# Patient Record
Sex: Female | Born: 1950 | Race: White | Hispanic: No | Smoking: Never smoker
Health system: Southern US, Community
[De-identification: ages and names within clinical notes are randomized; demographics above are authoritative.]

## PROBLEM LIST (undated history)

## (undated) DIAGNOSIS — E039 Hypothyroidism, unspecified: Secondary | ICD-10-CM

## (undated) DIAGNOSIS — E785 Hyperlipidemia, unspecified: Secondary | ICD-10-CM

## (undated) HISTORY — PX: ABDOMINAL HYSTERECTOMY: SHX81

## (undated) HISTORY — PX: CHOLECYSTECTOMY: SHX55

## (undated) HISTORY — DX: Hyperlipidemia, unspecified: E78.5

---

## 1999-12-27 ENCOUNTER — Encounter: Admission: RE | Admit: 1999-12-27 | Discharge: 1999-12-27 | Payer: Self-pay | Admitting: Internal Medicine

## 1999-12-27 ENCOUNTER — Encounter: Payer: Self-pay | Admitting: Internal Medicine

## 2000-01-07 ENCOUNTER — Encounter: Payer: Self-pay | Admitting: Internal Medicine

## 2000-01-07 ENCOUNTER — Encounter: Admission: RE | Admit: 2000-01-07 | Discharge: 2000-01-07 | Payer: Self-pay | Admitting: Internal Medicine

## 2000-01-10 ENCOUNTER — Other Ambulatory Visit: Admission: RE | Admit: 2000-01-10 | Discharge: 2000-01-10 | Payer: Self-pay | Admitting: Obstetrics and Gynecology

## 2001-07-09 ENCOUNTER — Encounter: Admission: RE | Admit: 2001-07-09 | Discharge: 2001-07-09 | Payer: Self-pay | Admitting: Internal Medicine

## 2001-07-09 ENCOUNTER — Encounter: Payer: Self-pay | Admitting: Internal Medicine

## 2001-08-14 ENCOUNTER — Other Ambulatory Visit: Admission: RE | Admit: 2001-08-14 | Discharge: 2001-08-14 | Payer: Self-pay | Admitting: Obstetrics and Gynecology

## 2002-08-17 ENCOUNTER — Encounter: Payer: Self-pay | Admitting: Obstetrics and Gynecology

## 2002-08-17 ENCOUNTER — Ambulatory Visit (HOSPITAL_COMMUNITY): Admission: RE | Admit: 2002-08-17 | Discharge: 2002-08-17 | Payer: Self-pay | Admitting: Obstetrics and Gynecology

## 2002-08-17 ENCOUNTER — Other Ambulatory Visit: Admission: RE | Admit: 2002-08-17 | Discharge: 2002-08-17 | Payer: Self-pay | Admitting: Obstetrics and Gynecology

## 2002-08-26 ENCOUNTER — Encounter: Admission: RE | Admit: 2002-08-26 | Discharge: 2002-10-29 | Payer: Self-pay | Admitting: Internal Medicine

## 2003-10-31 ENCOUNTER — Encounter (INDEPENDENT_AMBULATORY_CARE_PROVIDER_SITE_OTHER): Payer: Self-pay | Admitting: Specialist

## 2003-10-31 ENCOUNTER — Ambulatory Visit (HOSPITAL_COMMUNITY): Admission: RE | Admit: 2003-10-31 | Discharge: 2003-10-31 | Payer: Self-pay | Admitting: Gastroenterology

## 2004-11-26 ENCOUNTER — Ambulatory Visit (HOSPITAL_COMMUNITY): Admission: RE | Admit: 2004-11-26 | Discharge: 2004-11-26 | Payer: Self-pay | Admitting: Internal Medicine

## 2004-12-26 ENCOUNTER — Encounter: Admission: RE | Admit: 2004-12-26 | Discharge: 2004-12-26 | Payer: Self-pay | Admitting: Internal Medicine

## 2005-01-16 ENCOUNTER — Other Ambulatory Visit: Admission: RE | Admit: 2005-01-16 | Discharge: 2005-01-16 | Payer: Self-pay | Admitting: Obstetrics and Gynecology

## 2005-01-17 ENCOUNTER — Ambulatory Visit (HOSPITAL_COMMUNITY): Admission: RE | Admit: 2005-01-17 | Discharge: 2005-01-17 | Payer: Self-pay | Admitting: Obstetrics and Gynecology

## 2006-04-05 ENCOUNTER — Inpatient Hospital Stay (HOSPITAL_COMMUNITY): Admission: EM | Admit: 2006-04-05 | Discharge: 2006-04-06 | Payer: Self-pay | Admitting: Emergency Medicine

## 2006-04-05 ENCOUNTER — Encounter (INDEPENDENT_AMBULATORY_CARE_PROVIDER_SITE_OTHER): Payer: Self-pay | Admitting: *Deleted

## 2006-07-17 ENCOUNTER — Ambulatory Visit (HOSPITAL_COMMUNITY): Admission: RE | Admit: 2006-07-17 | Discharge: 2006-07-17 | Payer: Self-pay | Admitting: Obstetrics and Gynecology

## 2006-08-05 ENCOUNTER — Other Ambulatory Visit: Admission: RE | Admit: 2006-08-05 | Discharge: 2006-08-05 | Payer: Self-pay | Admitting: Obstetrics and Gynecology

## 2007-10-23 ENCOUNTER — Encounter: Admission: RE | Admit: 2007-10-23 | Discharge: 2007-10-23 | Payer: Self-pay | Admitting: Internal Medicine

## 2007-11-09 ENCOUNTER — Other Ambulatory Visit: Admission: RE | Admit: 2007-11-09 | Discharge: 2007-11-09 | Payer: Self-pay | Admitting: Obstetrics and Gynecology

## 2008-10-06 ENCOUNTER — Encounter: Admission: RE | Admit: 2008-10-06 | Discharge: 2008-10-06 | Payer: Self-pay | Admitting: Internal Medicine

## 2009-05-19 ENCOUNTER — Encounter: Admission: RE | Admit: 2009-05-19 | Discharge: 2009-05-19 | Payer: Self-pay | Admitting: Obstetrics and Gynecology

## 2010-05-17 ENCOUNTER — Ambulatory Visit: Payer: Self-pay | Admitting: Obstetrics and Gynecology

## 2010-05-17 ENCOUNTER — Other Ambulatory Visit: Admission: RE | Admit: 2010-05-17 | Discharge: 2010-05-17 | Payer: Self-pay | Admitting: Obstetrics and Gynecology

## 2010-10-28 ENCOUNTER — Encounter: Payer: Self-pay | Admitting: Internal Medicine

## 2011-02-22 NOTE — H&P (Signed)
NAMEKATTIA, SELLEY NO.:  [id number]   MEDICAL RECORD NO.:  [id number]          PATIENT TYPE:  EMS   LOCATION:  MAJO                         FACILITY:  [facility]   PHYSICIAN:  Ollen Gross. Vernell Morgans, M.D. DATE OF BIRTH:  Jan 08, 1951   DATE OF ADMISSION:  04/05/2006  DATE OF DISCHARGE:                                HISTORY & PHYSICAL   Ms. Walstad is 60 year old white female who presents with right upper quadrant  pain for the last 2 days.  The pain got acutely worse last night and was  associated with some significant nausea and vomiting.  This morning, she is  not running any fevers.  She feels a little bit better now that she has had  some pain medicine.  She otherwise denies any chest pain, shortness of  breath, diarrhea, dysuria.  The rest of review of systems is unremarkable.   PAST MEDICAL HISTORY:  Significant for acid reflux, irritable bowel  syndrome, gallstones.   PAST SURGICAL HISTORY:  Significant for laparoscopic tubal ligation and  transvaginal hysterectomy, and repair of her bladder.   MEDICATIONS:  Include Prevacid, multivitamin.   ALLERGIES:  PENICILLIN.   SOCIAL HISTORY:  She denies use of alcohol or tobacco products.   FAMILY HISTORY:  Noncontributory.   PHYSICAL EXAMINATION:  VITAL SIGNS:  Her temperature is 98.9, blood pressure  149/88, pulse 94.  GENERAL:  She is a well-developed, well-nourished white female in no acute  distress.  SKIN:  Warm and dry, no jaundice.  HEENT:  Eyes:  Extraocular muscles intact.  Pupils equal, round, and  reactive to light.  Sclerae anicteric.  LUNGS:  Clear bilaterally with no use of accessory respiratory muscles.  HEART:  Has a regular rate and rhythm with an impulse in the left chest.  ABDOMEN:  Soft, with focal right upper quadrant tenderness and some  fullness.  No peritonitis.  EXTREMITIES:  No cyanosis, clubbing, or edema.  Good strength in her arms  and legs.  PSYCHOLOGICALLY:  She is alert and  oriented x3 with no anxiety or  depression.   LABORATORY DATA:  Significant for a normal white count, normal liver  functions.  On review of her ultrasound, she did have stones in her  gallbladder, but no apparent gallbladder wall thickening or ductal  dilatation.   ASSESSMENT/PLAN:  This is a 60 year old white female with what appears to be  some significant gallbladder pain.  Because of the risks of this continuing,  I do think she would benefit from having her gallbladder removed.  She would  also like to have this done.  I have explained to her in detail the risks  and benefits of the operation to  remove the gallbladder, as well as some of the technical aspects, and she  understands and wishes to proceed.  We will obtain some routine preoperative  lab work in preparation of doing this for her hopefully later today or  tomorrow.      Ollen Gross. Vernell Morgans, M.D.  Electronically Signed     PST/MEDQ  D:  04/05/2006  T:  04/05/2006  Job:  [id number]

## 2011-02-22 NOTE — Op Note (Signed)
NAME:  Alexandra Mcgrath, Alexandra Mcgrath                          ACCOUNT NO.:  [id number]   MEDICAL RECORD NO.:  [id number]                   PATIENT TYPE:  AMB   LOCATION:  ENDO                                 FACILITY:  [facility]   PHYSICIAN:  Danise Edge, M.D.                DATE OF BIRTH:  06/22/1951   DATE OF PROCEDURE:  10/31/2003  DATE OF DISCHARGE:                                 OPERATIVE REPORT   PROCEDURE:  Colonoscopy with polypectomy.   ENDOSCOPIST:  Verlin Grills, M.D.   REFERRING PHYSICIAN:  Theressa Millard, M.D.   INDICATIONS FOR PROCEDURE:  [name] is a 60 year old female born  01/19/1951.  [name] is scheduled to undergo her first screening  colonoscopy with polypectomy to prevent colon cancer.   PREMEDICATION:  Versed 8 mg, Demerol 80 mg.   DESCRIPTION OF PROCEDURE:  After obtaining the informed consent, [name]  was placed in the left lateral decubitus position.  I administered  intravenous Demerol and intravenous Versed to achieve conscious sedation  before the procedure.  The patient's blood pressure, oxygen saturation, and  cardiac rhythm were monitored throughout the procedure and documented in the  medical record.   Anal inspection was normal.  Digital rectal exam was normal.  The Olympus  adjustable pediatric video colonoscope was introduced into the rectum and  easily advanced to the cecum.  Colonic preparation for the exam today was  excellent.   Rectum normal.   Sigmoid colon and descending colon:  From the distal sigmoid colon at 30 cm  from the anal verge, a 1 mm sessile polyp was removed with cold biopsy  forceps.   Splenic flexure normal.   Transverse colon:  From the proximal transverse colon a 5 mm sessile polyp  was removed with the electrocautery snare.   Hepatic flexure normal.   Ascending colon normal.   Cecum and ileocecal valve normal.   ASSESSMENT:  A 5 mm polyp was removed from the proximal transverse colon.  A  1 mm  sessile was removed from the distal sigmoid colon.  Both polyps were  submitted in one bottle for pathologic evaluation.   RECOMMENDATIONS:  Repeat colonoscopy in five years if polyps return  neoplastic pathologically.                                               Danise Edge, M.D.    MJ/MEDQ  D:  10/31/2003  T:  10/31/2003  Job:  [id number]   cc:   Theressa Millard, M.D.  [address] [place]  [place] [address]  Fax: [phone]

## 2011-02-22 NOTE — Op Note (Signed)
NAMELINSAY, VOGT NO.:  [id number]   MEDICAL RECORD NO.:  [id number]          PATIENT TYPE:  INP   LOCATION:  5733                         FACILITY:  [facility]   PHYSICIAN:  Ollen Gross. Vernell Morgans, M.D. DATE OF BIRTH:  1951-09-29   DATE OF PROCEDURE:  04/05/2006  DATE OF DISCHARGE:                                 OPERATIVE REPORT   PREOPERATIVE DIAGNOSES:  Cholecystitis, cholelithiasis.   POSTOPERATIVE DIAGNOSES:  Cholecystitis, cholelithiasis.   PROCEDURE:  Laparoscopic cholecystectomy.   SURGEON:  Ollen Gross. Carolynne Edouard, M.D.   ASSISTANT:  Leonie Man, M.D.   ANESTHESIA:  General endotracheal.   PROCEDURE:  After informed consent was obtained, the patient was brought to  the operating room and placed in the supine position on the operating room  table.  After adequate induction of general anesthesia, the patient's  abdomen was prepped with Betadine and draped in the usual sterile manner.  The area below the umbilicus was infiltrated with 0.25% Marcaine.  A small  incision was made with the #15 blade knife.  This incision was carried down  through the subcutaneous tissue bluntly with a hemostat and [employer]  retractors, until the linea alba was identified.  The linea alba was incised  with a 15 blade knife, and each side was grasped with Kocher clamps and  elevated anteriorly.  The preperitoneal space was then probed bluntly with a  hemostat, until the peritoneum was opened and access was gained to the  abdominal cavity.  A 0 Vicryl pursestring stitch was placed in the fascia  surrounding the opening.  Hasson cannula was placed through the opening and  anchored in place with the previously placed Vicryl pursestring stitch.  The  abdomen was then insufflated with carbon dioxide without difficulty.  The  patient was placed in the head-up position and rotated slightly with the  right side up.  The laparoscope was then inserted through the Hasson cannula  and the right  upper quadrant was inspected.  The gallbladder appeared to be  very inflamed and enlarged and was readily identifiable.   Next, the epigastric region was infiltrated with 0.25% Marcaine.  A small  incision was made with a #15 blade knife.  A 10 mm port was then placed  bluntly through this incision into the abdominal cavity under direct vision.  Sites were then chosen laterally on the right side of the abdomen, and  placement of 5 mm ports.  Each of these areas were infiltrated 0.25%  Marcaine.  Small stab incisions were made with a #15 blade knife and 5 mm  ports placed bluntly through these incisions into the abdominal cavity under  direct vision.  A Nezhat aspirator was then placed through the epigastric  port.  The dome of the gallbladder was punctured and aspirated.  The  gallbladder was full of clear fluid.  A blunt grasper was then placed  through the lateral-most 5 mm port, and used to grasp the dome of the  gallbladder and elevate it anteriorly and superiorly.  Another blunt grasper  was placed through the other 5 mm  port and used to retract on the body and  neck of the gallbladder.  A dissector was placed through the epigastric  port, and using the electrocautery the peritoneal reflection at the  gallbladder neck area was opened.  Blunt dissection was then carried out in  this area, until the gallbladder neck-cystic duct junction was readily  identified and a good window was created.  A single clip was placed on the  gallbladder neck.  A small ductotomy was made just below the clip.  A 14  gauge Angiocath was placed percutaneously through the anterior abdominal  wall under direct vision.  The Reddick cholangiogram catheter was placed  through the Angiocath and flushed.   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   At this  point the cholangiogram was abandoned.  The 2 clips were placed proximally  on the cystic duct and the duct was divided between the 2 sets of clips.  Posteriorly the cystic artery was identified and again dissected bluntly in  a circumferential manner, until a good window was created.  Two clips were  placed proximally and distally on the artery, and the artery was divided  between the 2 sets of clips.   Next, a laparoscopic hook cautery device was used to separate the  gallbladder from the liver bed, prior to completely detaching the  gallbladder from the liver bed.  The liver bed was inspected and several  small bleeding points were coagulated with the electrocautery, until the  area was completely hemostatic.  The gallbladder was then detached the rest  of the way from the liver bed without difficulty, using the hook  electrocautery.  A laparoscopic bag was inserted through to the epigastric  port.  The gallbladder was placed in the bag and the bag was sealed.  The  abdomen was then irrigated with copious amounts of saline until the effluent  was clear.  The laparoscope was then moved to the epigastric port.  A  gallbladder grasper was placed through the Hasson cannula and used to grasp  the opening of  the bag.  The bag with the gallbladder was then removed  through the infraumbilical port without difficulty.  The fascial defect was  closed with the previously placed Vicryl pursestring stitch, as well as with  another interrupted figure-of-eight 0 Vicryl stitch.  The rest of the ports  were removed under direct vision and were found to be hemostatic.  Gas was  allowed to escape.  The skin incisions were all closed with interrupted 4-0  Monocryl subcuticular stitches.  Benzoin, Steri-Strips and  sterile  dressings were applied.  The patient tolerated the procedure well.  At the end of the case all needle,  sponge and instrument counts were correct.  The  patient was then  awakened and taken to recovery room in stable condition.      Ollen Gross. Vernell Morgans, M.D.  Electronically Signed     PST/MEDQ  D:  04/05/2006  T:  04/05/2006  Job:  [id number]

## 2011-03-01 ENCOUNTER — Other Ambulatory Visit: Payer: Self-pay | Admitting: Internal Medicine

## 2011-03-01 DIAGNOSIS — Z1239 Encounter for other screening for malignant neoplasm of breast: Secondary | ICD-10-CM

## 2011-03-01 DIAGNOSIS — Z1231 Encounter for screening mammogram for malignant neoplasm of breast: Secondary | ICD-10-CM

## 2011-05-07 ENCOUNTER — Ambulatory Visit
Admission: RE | Admit: 2011-05-07 | Discharge: 2011-05-07 | Disposition: A | Discharge status: Home / Self Care | Payer: 59 | Source: Ambulatory Visit | Attending: Internal Medicine | Admitting: Internal Medicine

## 2011-05-07 DIAGNOSIS — Z1231 Encounter for screening mammogram for malignant neoplasm of breast: Secondary | ICD-10-CM

## 2011-06-04 ENCOUNTER — Ambulatory Visit: Payer: Self-pay

## 2012-04-02 ENCOUNTER — Other Ambulatory Visit: Payer: Self-pay | Admitting: Internal Medicine

## 2012-04-02 DIAGNOSIS — Z1231 Encounter for screening mammogram for malignant neoplasm of breast: Secondary | ICD-10-CM

## 2012-05-13 ENCOUNTER — Ambulatory Visit: Payer: 59

## 2012-05-19 ENCOUNTER — Ambulatory Visit
Admission: RE | Admit: 2012-05-19 | Discharge: 2012-05-19 | Disposition: A | Discharge status: Home / Self Care | Payer: 59 | Source: Ambulatory Visit | Attending: Internal Medicine | Admitting: Internal Medicine

## 2012-05-19 DIAGNOSIS — Z1231 Encounter for screening mammogram for malignant neoplasm of breast: Secondary | ICD-10-CM

## 2013-11-25 ENCOUNTER — Other Ambulatory Visit: Payer: Self-pay | Admitting: Gastroenterology

## 2014-06-23 ENCOUNTER — Other Ambulatory Visit: Payer: Self-pay

## 2014-06-23 DIAGNOSIS — Z1231 Encounter for screening mammogram for malignant neoplasm of breast: Secondary | ICD-10-CM

## 2014-07-01 ENCOUNTER — Ambulatory Visit
Admission: RE | Admit: 2014-07-01 | Discharge: 2014-07-01 | Disposition: A | Discharge status: Home / Self Care | Payer: 59 | Source: Ambulatory Visit

## 2014-07-01 ENCOUNTER — Encounter (INDEPENDENT_AMBULATORY_CARE_PROVIDER_SITE_OTHER): Payer: Self-pay

## 2014-07-01 DIAGNOSIS — Z1231 Encounter for screening mammogram for malignant neoplasm of breast: Secondary | ICD-10-CM

## 2014-08-10 ENCOUNTER — Other Ambulatory Visit: Payer: Self-pay | Admitting: Gastroenterology

## 2014-11-22 ENCOUNTER — Encounter (HOSPITAL_COMMUNITY): Payer: Self-pay | Admitting: *Deleted

## 2014-11-24 ENCOUNTER — Other Ambulatory Visit: Payer: Self-pay | Admitting: Gastroenterology

## 2014-11-29 ENCOUNTER — Ambulatory Visit (HOSPITAL_COMMUNITY)
Admission: RE | Admit: 2014-11-29 | Discharge: 2014-11-29 | Disposition: A | Discharge status: Home / Self Care | Payer: 59 | Source: Ambulatory Visit | Attending: Gastroenterology | Admitting: Gastroenterology

## 2014-11-29 ENCOUNTER — Encounter (HOSPITAL_COMMUNITY)
Admission: RE | Disposition: A | Discharge status: Home / Self Care | Payer: Self-pay | Source: Ambulatory Visit | Attending: Gastroenterology

## 2014-11-29 ENCOUNTER — Encounter (HOSPITAL_COMMUNITY): Payer: Self-pay

## 2014-11-29 ENCOUNTER — Ambulatory Visit (HOSPITAL_COMMUNITY): Payer: 59 | Admitting: Anesthesiology

## 2014-11-29 DIAGNOSIS — K219 Gastro-esophageal reflux disease without esophagitis: Secondary | ICD-10-CM | POA: Diagnosis not present

## 2014-11-29 DIAGNOSIS — Z9889 Other specified postprocedural states: Secondary | ICD-10-CM | POA: Insufficient documentation

## 2014-11-29 DIAGNOSIS — E039 Hypothyroidism, unspecified: Secondary | ICD-10-CM | POA: Diagnosis not present

## 2014-11-29 DIAGNOSIS — Z86010 Personal history of colonic polyps: Secondary | ICD-10-CM | POA: Insufficient documentation

## 2014-11-29 DIAGNOSIS — K589 Irritable bowel syndrome without diarrhea: Secondary | ICD-10-CM | POA: Insufficient documentation

## 2014-11-29 DIAGNOSIS — Z90710 Acquired absence of both cervix and uterus: Secondary | ICD-10-CM | POA: Diagnosis not present

## 2014-11-29 DIAGNOSIS — Z09 Encounter for follow-up examination after completed treatment for conditions other than malignant neoplasm: Secondary | ICD-10-CM | POA: Diagnosis not present

## 2014-11-29 DIAGNOSIS — M810 Age-related osteoporosis without current pathological fracture: Secondary | ICD-10-CM | POA: Insufficient documentation

## 2014-11-29 HISTORY — DX: Hypothyroidism, unspecified: E03.9

## 2014-11-29 HISTORY — PX: COLONOSCOPY WITH PROPOFOL: SHX5780

## 2014-11-29 SURGERY — COLONOSCOPY WITH PROPOFOL
Anesthesia: Monitor Anesthesia Care

## 2014-11-29 MED ORDER — FENTANYL CITRATE 0.05 MG/ML IJ SOLN: 25.0000 ug | INTRAMUSCULAR | Status: DC | PRN

## 2014-11-29 MED ORDER — PROPOFOL 10 MG/ML IV BOLUS
INTRAVENOUS | Status: AC
  Filled 2014-11-29: qty 20

## 2014-11-29 MED ORDER — LIDOCAINE HCL (CARDIAC) 20 MG/ML IV SOLN
INTRAVENOUS | Status: AC
  Filled 2014-11-29: qty 5

## 2014-11-29 MED ORDER — LACTATED RINGERS IV SOLN
INTRAVENOUS | Status: DC
  Administered 2014-11-29: 1000 mL via INTRAVENOUS

## 2014-11-29 MED ORDER — PROPOFOL INFUSION 10 MG/ML OPTIME
INTRAVENOUS | Status: DC | PRN
  Administered 2014-11-29: 250 ug/kg/min via INTRAVENOUS

## 2014-11-29 MED ORDER — PROPOFOL 10 MG/ML IV BOLUS
INTRAVENOUS | Status: DC | PRN
  Administered 2014-11-29: 40 mg via INTRAVENOUS
  Administered 2014-11-29: 30 mg via INTRAVENOUS
  Administered 2014-11-29: 50 mg via INTRAVENOUS
  Administered 2014-11-29 (×2): 10 mg via INTRAVENOUS
  Administered 2014-11-29: 30 mg via INTRAVENOUS

## 2014-11-29 MED ORDER — LIDOCAINE HCL (CARDIAC) 20 MG/ML IV SOLN
INTRAVENOUS | Status: DC | PRN
  Administered 2014-11-29: 50 mg via INTRAVENOUS

## 2014-11-29 MED ORDER — GLYCOPYRROLATE 0.2 MG/ML IJ SOLN
INTRAMUSCULAR | Status: DC | PRN
  Administered 2014-11-29 (×4): .05 mg via INTRAVENOUS

## 2014-11-29 MED ORDER — KETAMINE HCL 10 MG/ML IJ SOLN
INTRAMUSCULAR | Status: AC
  Filled 2014-11-29: qty 1

## 2014-11-29 MED ORDER — GLYCOPYRROLATE 0.2 MG/ML IJ SOLN
INTRAMUSCULAR | Status: AC
  Filled 2014-11-29: qty 1

## 2014-11-29 MED ORDER — SODIUM CHLORIDE 0.9 % IV SOLN: INTRAVENOUS | Status: DC

## 2014-11-29 MED ORDER — KETAMINE HCL 10 MG/ML IJ SOLN
INTRAMUSCULAR | Status: DC | PRN
  Administered 2014-11-29: 20 mg via INTRAVENOUS

## 2014-11-29 SURGICAL SUPPLY — 22 items

## 2014-11-29 NOTE — Anesthesia Postprocedure Evaluation (Signed)
  Anesthesia Post-op Note  Patient: Alexandra Mcgrath  Procedure(s) Performed: Procedure(s): COLONOSCOPY WITH PROPOFOL (N/A)  Patient Location: PACU  Anesthesia Type: MAC  Level of Consciousness: awake and alert   Airway and Oxygen Therapy: Patient Spontanous Breathing  Post-op Pain: none  Post-op Assessment: Post-op Vital signs reviewed, Patient's Cardiovascular Status Stable and Respiratory Function Stable  Post-op Vital Signs: Reviewed  Filed Vitals:   11/29/14 0940  BP: 143/85  Pulse: 76  Temp:   Resp: 17    Complications: No apparent anesthesia complications

## 2014-11-29 NOTE — H&P (Signed)
  Procedure: Surveillance colonoscopy. 11/25/2013 colonoscopy with removal of a 10 mm sessile serrated adenomatous ascending colon polyp, a 7 mm sessile serrated ascending colon polyp, a 6 mm sessile serrated ascending colon polyp.  History: The patient is a 64 year old female born January 04, 1951. Her chronic watery diarrhea has resolved after using probiotic therapy. She is scheduled to undergo a surveillance colonoscopy today.  Past medical history: Cholecystectomy. Tonsillectomy. Tubal ligation. Hysterectomy. Bladder suspension surgery. Allergic rhinitis. Irritable bowel syndrome. Gastroesophageal reflux. Osteoporosis. Low back pain.  Medication allergies: Penicillin and sulfa drugs  Exam: The patient is alert and lying comfortably on the endoscopy stretcher. Abdomen is soft and nontender to palpation. Lungs are clear to auscultation. Cardiac exam reveals a regular rhythm.  Plan: Proceed with surveillance colonoscopy following removal of multiple sessile serrated adenomatous polyps from the ascending colon in February 2015.

## 2014-11-29 NOTE — Op Note (Signed)
Procedure: Surveillance colonoscopy. 11/25/2013 colonoscopy with removal of three sessile serrated adenomatous polyps ranging in diameter from 6 mm to 10 mm.  Endoscopist: Earle Gell  Premedication: Propofol administered by anesthesia  Procedure: The patient was placed in the left lateral decubitus position. Anal inspection and digital rectal exam were normal. The Pentax pediatric colonoscope was introduced into the rectum and advanced to the cecum. A normal-appearing appendiceal orifice was identified. A normal-appearing ileocecal valve was identified. Colonic preparation for the exam today was good. Withdrawal time was 14 minutes  Rectum. Normal. Retroflexed view of the distal rectum normal  Sigmoid colon and descending colon. Normal  Splenic flexure. Normal  Transverse colon. Normal  Hepatic flexure. Normal  Ascending colon. Normal  Cecum and ileocecal valve. Normal  Assessment: Normal surveillance colonoscopy  Recommendation: Schedule surveillance colonoscopy in 5 years

## 2014-11-29 NOTE — Discharge Instructions (Signed)
Colonoscopy, Care After °These instructions give you information on caring for yourself after your procedure. Your doctor may also give you more specific instructions. Call your doctor if you have any problems or questions after your procedure. °HOME CARE °· Do not drive for 24 hours. °· Do not sign important papers or use machinery for 24 hours. °· You may shower. °· You may go back to your usual activities, but go slower for the first 24 hours. °· Take rest breaks often during the first 24 hours. °· Walk around or use warm packs on your belly (abdomen) if you have belly cramping or gas. °· Drink enough fluids to keep your pee (urine) clear or pale yellow. °· Resume your normal diet. Avoid heavy or fried foods. °· Avoid drinking alcohol for 24 hours or as told by your doctor. °· Only take medicines as told by your doctor. °If a tissue sample (biopsy) was taken during the procedure:  °· Do not take aspirin or blood thinners for 7 days, or as told by your doctor. °· Do not drink alcohol for 7 days, or as told by your doctor. °· Eat soft foods for the first 24 hours. °GET HELP IF: °You still have a small amount of blood in your poop (stool) 2-3 days after the procedure. °GET HELP RIGHT AWAY IF: °· You have more than a small amount of blood in your poop. °· You see clumps of tissue (blood clots) in your poop. °· Your belly is puffy (swollen). °· You feel sick to your stomach (nauseous) or throw up (vomit). °· You have a fever. °· You have belly pain that gets worse and medicine does not help. °MAKE SURE YOU: °· Understand these instructions. °· Will watch your condition. °· Will get help right away if you are not doing well or get worse. °Document Released: 10/26/2010 Document Revised: 09/28/2013 Document Reviewed: 05/31/2013 °ExitCare® Patient Information ©2015 [employer]. This information is not intended to replace advice given to you by your health care provider. Make sure you discuss any questions you have with  your health care provider. ° °

## 2014-11-29 NOTE — Anesthesia Preprocedure Evaluation (Addendum)
Anesthesia Evaluation  Patient identified by MRN, date of birth, ID band Patient awake    Reviewed: Allergy & Precautions, H&P , NPO status , Patient's Chart, lab work & pertinent test results  Airway Mallampati: II  TM Distance: >3 FB Neck ROM: Full    Dental no notable dental hx. (+) Teeth Intact, Dental Advisory Given   Pulmonary neg pulmonary ROS,  breath sounds clear to auscultation  Pulmonary exam normal       Cardiovascular negative cardio ROS  Rhythm:Regular Rate:Normal     Neuro/Psych negative neurological ROS  negative psych ROS   GI/Hepatic negative GI ROS, Neg liver ROS,   Endo/Other  negative endocrine ROSHypothyroidism   Renal/GU negative Renal ROS  negative genitourinary   Musculoskeletal   Abdominal   Peds  Hematology negative hematology ROS (+)   Anesthesia Other Findings   Reproductive/Obstetrics negative OB ROS                            Anesthesia Physical Anesthesia Plan  ASA: II  Anesthesia Plan: MAC   Post-op Pain Management:    Induction: Intravenous  Airway Management Planned: Simple Face Mask  Additional Equipment:   Intra-op Plan:   Post-operative Plan:   Informed Consent: I have reviewed the patients History and Physical, chart, labs and discussed the procedure including the risks, benefits and alternatives for the proposed anesthesia with the patient or authorized representative who has indicated his/her understanding and acceptance.   Dental advisory given  Plan Discussed with: CRNA and Surgeon  Anesthesia Plan Comments:         Anesthesia Quick Evaluation

## 2014-11-29 NOTE — Transfer of Care (Signed)
Immediate Anesthesia Transfer of Care Note  Patient: Alexandra Mcgrath  Procedure(s) Performed: Procedure(s): COLONOSCOPY WITH PROPOFOL (N/A)  Patient Location: [facility]   Anesthesia Type:MAC  Level of Consciousness: Patient easily awoken, sedated, comfortable, cooperative, following commands, responds to stimulation.   Airway & Oxygen Therapy: Patient spontaneously breathing, ventilating well, oxygen via simple oxygen mask.  Post-op Assessment: Report given to PACU RN, vital signs reviewed and stable, moving all extremities.   Post vital signs: Reviewed and stable.  Complications: No apparent anesthesia complications

## 2014-11-30 ENCOUNTER — Encounter (HOSPITAL_COMMUNITY): Payer: Self-pay | Admitting: Gastroenterology

## 2015-08-29 ENCOUNTER — Ambulatory Visit: Payer: 59 | Admitting: Allergy and Immunology

## 2015-10-04 ENCOUNTER — Ambulatory Visit
Admission: RE | Admit: 2015-10-04 | Discharge: 2015-10-04 | Disposition: A | Discharge status: Home / Self Care | Payer: Commercial Managed Care - HMO | Source: Ambulatory Visit | Attending: Internal Medicine | Admitting: Internal Medicine

## 2015-10-04 ENCOUNTER — Other Ambulatory Visit: Payer: Self-pay | Admitting: Internal Medicine

## 2015-10-04 DIAGNOSIS — M79672 Pain in left foot: Secondary | ICD-10-CM

## 2016-06-29 DIAGNOSIS — N390 Urinary tract infection, site not specified: Secondary | ICD-10-CM | POA: Diagnosis not present

## 2016-07-09 DIAGNOSIS — R300 Dysuria: Secondary | ICD-10-CM | POA: Diagnosis not present

## 2016-07-09 DIAGNOSIS — R197 Diarrhea, unspecified: Secondary | ICD-10-CM | POA: Diagnosis not present

## 2016-11-15 DIAGNOSIS — Z0000 Encounter for general adult medical examination without abnormal findings: Secondary | ICD-10-CM | POA: Diagnosis not present

## 2016-11-15 DIAGNOSIS — Z1389 Encounter for screening for other disorder: Secondary | ICD-10-CM | POA: Diagnosis not present

## 2016-11-15 DIAGNOSIS — Z79899 Other long term (current) drug therapy: Secondary | ICD-10-CM | POA: Diagnosis not present

## 2016-12-09 DIAGNOSIS — R300 Dysuria: Secondary | ICD-10-CM | POA: Diagnosis not present

## 2017-01-30 DIAGNOSIS — L821 Other seborrheic keratosis: Secondary | ICD-10-CM | POA: Diagnosis not present

## 2017-01-30 DIAGNOSIS — D1800 Hemangioma unspecified site: Secondary | ICD-10-CM | POA: Diagnosis not present

## 2017-01-30 DIAGNOSIS — D2372 Other benign neoplasm of skin of left lower limb, including hip: Secondary | ICD-10-CM | POA: Diagnosis not present

## 2017-01-30 DIAGNOSIS — D225 Melanocytic nevi of trunk: Secondary | ICD-10-CM | POA: Diagnosis not present

## 2017-01-30 DIAGNOSIS — D2262 Melanocytic nevi of left upper limb, including shoulder: Secondary | ICD-10-CM | POA: Diagnosis not present

## 2017-01-30 DIAGNOSIS — L918 Other hypertrophic disorders of the skin: Secondary | ICD-10-CM | POA: Diagnosis not present

## 2017-01-30 DIAGNOSIS — L570 Actinic keratosis: Secondary | ICD-10-CM | POA: Diagnosis not present

## 2017-01-30 DIAGNOSIS — D2371 Other benign neoplasm of skin of right lower limb, including hip: Secondary | ICD-10-CM | POA: Diagnosis not present

## 2017-01-30 DIAGNOSIS — D2261 Melanocytic nevi of right upper limb, including shoulder: Secondary | ICD-10-CM | POA: Diagnosis not present

## 2017-05-01 DIAGNOSIS — R300 Dysuria: Secondary | ICD-10-CM | POA: Diagnosis not present

## 2017-05-01 DIAGNOSIS — W57XXXA Bitten or stung by nonvenomous insect and other nonvenomous arthropods, initial encounter: Secondary | ICD-10-CM | POA: Diagnosis not present

## 2017-05-06 DIAGNOSIS — J301 Allergic rhinitis due to pollen: Secondary | ICD-10-CM | POA: Diagnosis not present

## 2017-05-06 DIAGNOSIS — R300 Dysuria: Secondary | ICD-10-CM | POA: Diagnosis not present

## 2017-05-06 DIAGNOSIS — W57XXXA Bitten or stung by nonvenomous insect and other nonvenomous arthropods, initial encounter: Secondary | ICD-10-CM | POA: Diagnosis not present

## 2017-05-06 DIAGNOSIS — E039 Hypothyroidism, unspecified: Secondary | ICD-10-CM | POA: Diagnosis not present

## 2017-05-06 DIAGNOSIS — R21 Rash and other nonspecific skin eruption: Secondary | ICD-10-CM | POA: Diagnosis not present

## 2017-07-16 DIAGNOSIS — R5383 Other fatigue: Secondary | ICD-10-CM | POA: Diagnosis not present

## 2017-07-16 DIAGNOSIS — J029 Acute pharyngitis, unspecified: Secondary | ICD-10-CM | POA: Diagnosis not present

## 2017-07-16 DIAGNOSIS — Z79899 Other long term (current) drug therapy: Secondary | ICD-10-CM | POA: Diagnosis not present

## 2017-07-16 DIAGNOSIS — E039 Hypothyroidism, unspecified: Secondary | ICD-10-CM | POA: Diagnosis not present

## 2017-07-31 DIAGNOSIS — J3089 Other allergic rhinitis: Secondary | ICD-10-CM | POA: Diagnosis not present

## 2017-07-31 DIAGNOSIS — J029 Acute pharyngitis, unspecified: Secondary | ICD-10-CM | POA: Diagnosis not present

## 2017-08-17 DIAGNOSIS — N390 Urinary tract infection, site not specified: Secondary | ICD-10-CM | POA: Diagnosis not present

## 2017-10-12 DIAGNOSIS — N390 Urinary tract infection, site not specified: Secondary | ICD-10-CM | POA: Diagnosis not present

## 2018-02-13 DIAGNOSIS — L814 Other melanin hyperpigmentation: Secondary | ICD-10-CM | POA: Diagnosis not present

## 2018-02-13 DIAGNOSIS — L986 Other infiltrative disorders of the skin and subcutaneous tissue: Secondary | ICD-10-CM | POA: Diagnosis not present

## 2018-02-13 DIAGNOSIS — D2262 Melanocytic nevi of left upper limb, including shoulder: Secondary | ICD-10-CM | POA: Diagnosis not present

## 2018-02-13 DIAGNOSIS — D225 Melanocytic nevi of trunk: Secondary | ICD-10-CM | POA: Diagnosis not present

## 2018-02-13 DIAGNOSIS — D485 Neoplasm of uncertain behavior of skin: Secondary | ICD-10-CM | POA: Diagnosis not present

## 2018-02-13 DIAGNOSIS — L719 Rosacea, unspecified: Secondary | ICD-10-CM | POA: Diagnosis not present

## 2018-02-13 DIAGNOSIS — L219 Seborrheic dermatitis, unspecified: Secondary | ICD-10-CM | POA: Diagnosis not present

## 2018-02-13 DIAGNOSIS — D2371 Other benign neoplasm of skin of right lower limb, including hip: Secondary | ICD-10-CM | POA: Diagnosis not present

## 2018-02-13 DIAGNOSIS — D2372 Other benign neoplasm of skin of left lower limb, including hip: Secondary | ICD-10-CM | POA: Diagnosis not present

## 2018-04-03 DIAGNOSIS — H2513 Age-related nuclear cataract, bilateral: Secondary | ICD-10-CM | POA: Diagnosis not present

## 2018-04-03 DIAGNOSIS — H52203 Unspecified astigmatism, bilateral: Secondary | ICD-10-CM | POA: Diagnosis not present

## 2018-04-03 DIAGNOSIS — H524 Presbyopia: Secondary | ICD-10-CM | POA: Diagnosis not present

## 2018-09-11 ENCOUNTER — Other Ambulatory Visit: Payer: Self-pay | Admitting: Geriatric Medicine

## 2018-09-11 DIAGNOSIS — Z1231 Encounter for screening mammogram for malignant neoplasm of breast: Secondary | ICD-10-CM

## 2018-09-11 DIAGNOSIS — Z0000 Encounter for general adult medical examination without abnormal findings: Secondary | ICD-10-CM | POA: Diagnosis not present

## 2018-09-11 DIAGNOSIS — E739 Lactose intolerance, unspecified: Secondary | ICD-10-CM | POA: Diagnosis not present

## 2018-09-11 DIAGNOSIS — M8588 Other specified disorders of bone density and structure, other site: Secondary | ICD-10-CM

## 2018-09-11 DIAGNOSIS — M5432 Sciatica, left side: Secondary | ICD-10-CM | POA: Diagnosis not present

## 2018-09-11 DIAGNOSIS — K219 Gastro-esophageal reflux disease without esophagitis: Secondary | ICD-10-CM | POA: Diagnosis not present

## 2018-09-11 DIAGNOSIS — F439 Reaction to severe stress, unspecified: Secondary | ICD-10-CM | POA: Diagnosis not present

## 2018-09-11 DIAGNOSIS — Z79899 Other long term (current) drug therapy: Secondary | ICD-10-CM | POA: Diagnosis not present

## 2018-09-11 DIAGNOSIS — K5901 Slow transit constipation: Secondary | ICD-10-CM | POA: Diagnosis not present

## 2018-09-11 DIAGNOSIS — Z1239 Encounter for other screening for malignant neoplasm of breast: Secondary | ICD-10-CM | POA: Diagnosis not present

## 2018-09-11 DIAGNOSIS — Z1389 Encounter for screening for other disorder: Secondary | ICD-10-CM | POA: Diagnosis not present

## 2018-09-23 ENCOUNTER — Ambulatory Visit (INDEPENDENT_AMBULATORY_CARE_PROVIDER_SITE_OTHER): Payer: Medicare Other | Admitting: Psychiatry

## 2018-09-23 DIAGNOSIS — F4323 Adjustment disorder with mixed anxiety and depressed mood: Secondary | ICD-10-CM

## 2018-09-23 NOTE — Progress Notes (Signed)
[facility] Initial Adult Exam- Part I  Name: Alexandra Mcgrath Date: 09/23/2018 MRN: [id number] DOB: Aug 23, 1951 PCP: Lajean Manes, MD  Time spent:  60 minutes   Guardian/Payee:  patient  Paperwork requested:  No   Reason for Visit /Presenting Problem:  Grieving husband's cancer (multiple episodes); overwhelmed  Mental Status Exam:   Appearance:   Casual     Behavior:  Appropriate and Sharing  Motor:  Normal  Speech/Language:   Normal Rate  Affect:  Depressed and Tearful  Mood:  anxious, depressed and sad  Thought process:  normal  Thought content:    WNL  Sensory/Perceptual disturbances:    WNL  Orientation:  oriented to person, place, time/date, situation, day of week, month of year and year  Attention:  Good  Concentration:  Good  Memory:  WNL  Fund of knowledge:   Good  Insight:    Good  Judgment:   Good  Impulse Control:  Good   Reported Symptoms:  Sleep disturbance, Fatigue and Lack of motivation, grief, sadness, anxiety, depression   Risk Assessment: Danger to Self:  No Self-injurious Behavior: No Danger to Others: No Duty to Warn:no Physical Aggression / Violence:No  Access to Firearms a concern: No  Gang Involvement:No  Patient / guardian was educated about steps to take if suicide or homicide risk level increases between visits: n/a While future psychiatric events cannot be accurately predicted, the patient does not currently require acute inpatient psychiatric care and does not currently meet [facility] involuntary commitment criteria.  Substance Abuse History: Current substance abuse: No     Past Psychiatric History:   Previous psychological history is significant for marital Outpatient Providers: Dr. Ovidio Hanger History of Psych Hospitalization: No  Psychological Testing: none known    Medical History/Surgical History:  Reviewed with patient who states that she is not on any prescribed meds but does take supplements.   Past Medical  History:  Diagnosis Date  . Hypothyroidism     Past Surgical History:  Procedure Laterality Date  . ABDOMINAL HYSTERECTOMY     partial  . CHOLECYSTECTOMY    . COLONOSCOPY WITH PROPOFOL N/A 11/29/2014   Procedure: COLONOSCOPY WITH PROPOFOL;  Surgeon: Garlan Fair, MD;  Location: [facility];  Service: Endoscopy;  Laterality: N/A;    Medications: Current Outpatient Medications  Medication Sig Dispense Refill  . acidophilus (RISAQUAD) CAPS capsule Take 1 capsule by mouth daily.    Francia Greaves THYROID 30 MG tablet Take 15 mg by mouth daily before breakfast.   6  . B Complex-C (B-COMPLEX WITH VITAMIN C) tablet Take 1 tablet by mouth daily.    Jolyne Loa Grape-Goldenseal (BERBERINE COMPLEX PO) Take 1 capsule by mouth daily.    Marland Kitchen ibuprofen (ADVIL,MOTRIN) 200 MG tablet Take 400 mg by mouth every 6 (six) hours as needed for mild pain or moderate pain.    Marland Kitchen loratadine (CLARITIN) 10 MG tablet Take 10 mg by mouth daily as needed for allergies.    . Multiple Vitamin (MULTIVITAMIN WITH MINERALS) TABS tablet Take 1 tablet by mouth daily.    Marland Kitchen OVER THE COUNTER MEDICATION Take by mouth daily.    . vitamin C (ASCORBIC ACID) 500 MG tablet Take 500 mg by mouth daily.     No current facility-administered medications for this visit.     Allergies  Allergen Reactions  . Sulfa Antibiotics Itching  . Penicillins Other (See Comments)    Passed out  . Tomato Rash    headache  Abuse History: Victim: none Report needed: no Perpetrator of abuse: no Witness / Exposure to Domestic Violence:  none Protective Services Involvement: no Witness to [employer] Violence:  no   Family / Social History:    Living situation: lives with husband of 88 yrs Sexual Orientation: straight Relationship Status:   married Name of spouse / other:  [name], [name]. If a parent, number of children / ages:  7, ages 42 and 66, daughter and son  Support Systems: church, family  Financial Stress:   yes  Income/Employment/Disability:   Pt. Is retired and so is husband.  They run a lawn care maintenance company.  Military Service: no  Educational History:   Graduated from [employer] with major in science  Religion/Sprituality/World View:    [place]  Any cultural differences that may affect / interfere with treatment:  no  Recreation/Hobbies: watch HGTV, video games, cookin, being with family and friends  Stressors:  Husband's cancer, tasks and deadlines  Strengths:  Friendly, "I seem to be the [place] person for family", people respect her opinion, good caregiver  Barriers: I'm a bit introverted.  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  Patient will work through her grief/anticipatory grief regarding her husband's return of cancer to the point she feels less fearful and more empowered to cope with husband's illness.         Shanon Ace, LCSW

## 2018-10-21 ENCOUNTER — Ambulatory Visit (INDEPENDENT_AMBULATORY_CARE_PROVIDER_SITE_OTHER): Payer: Medicare Other | Admitting: Psychiatry

## 2018-10-21 DIAGNOSIS — F4323 Adjustment disorder with mixed anxiety and depressed mood: Secondary | ICD-10-CM | POA: Diagnosis not present

## 2018-10-21 NOTE — Progress Notes (Signed)
      [facility] Counselor/Therapist Progress Note  Patient ID: Alexandra Mcgrath, MRN: [id number],    Date: 10/21/2018  Time Spent:  60 minutes Treatment Type: Individual Therapy  Reported Symptoms:   Anxiety, lots of  Uncertainty re: husband's cancer   Mental Status Exam:  Appearance:   Casual     Behavior:  Appropriate and Sharing  Motor:  Normal  Speech/Language:   Clear and Coherent  Affect:  Congruent  Mood:  anxious  Thought process:  normal  Thought content:    WNL  Sensory/Perceptual disturbances:    WNL  Orientation:  oriented to person, place, time/date, situation, day of week, month of year and year  Attention:  Good  Concentration:  Good  Memory:  WNL  Fund of knowledge:   Good  Insight:    Good  Judgment:   Good  Impulse Control:  Good   Risk Assessment: Danger to Self:  No Self-injurious Behavior: No Danger to Others: No Duty to Warn:no Physical Aggression / Violence:No  Access to Firearms a concern: No  Gang Involvement:No   Subjective:  Patient and husband at [facility] this week for second chemo treatment. Emotions up and down.  Had good trip away to see family near holidays "but came back to chemo treatments for husband."  Tearful, fearful and very concerned for husband as this is his second bout of cancer and treatment.  On a 1-10 scaled, I'm an "8" re: fear right now.  Daughter living in [place] is being transferred to [place], so patient is looking forward to that.   Family very supportive of patient and husband.  Looked at some strategies that can help patient manage her anxiety, depression, and fear ---strategies that can impact her emotionally, physically, and spiritually. Calming exercises, setting limits, maintaining social contacts, prayers for her husband and herself, eating healthy, exercise, and positive thought patterns.     Interventions: Solution-Oriented/Positive Psychology and Ego-Supportive  Diagnosis:   ICD-10-CM   1. Adjustment  disorder with mixed anxiety and depressed mood F43.23     Plan: Patient to follow through with strategies shared above to help her stay grounded and manage her symptoms.  To return in 2 wks.  Shanon Ace, LCSW

## 2018-11-04 ENCOUNTER — Ambulatory Visit
Admission: RE | Admit: 2018-11-04 | Discharge: 2018-11-04 | Disposition: A | Discharge status: Home / Self Care | Payer: Medicare Other | Source: Ambulatory Visit | Attending: Geriatric Medicine | Admitting: Geriatric Medicine

## 2018-11-04 ENCOUNTER — Ambulatory Visit (INDEPENDENT_AMBULATORY_CARE_PROVIDER_SITE_OTHER): Payer: Medicare Other | Admitting: Psychiatry

## 2018-11-04 DIAGNOSIS — F4323 Adjustment disorder with mixed anxiety and depressed mood: Secondary | ICD-10-CM

## 2018-11-04 DIAGNOSIS — Z1231 Encounter for screening mammogram for malignant neoplasm of breast: Secondary | ICD-10-CM | POA: Diagnosis not present

## 2018-11-04 DIAGNOSIS — M810 Age-related osteoporosis without current pathological fracture: Secondary | ICD-10-CM | POA: Diagnosis not present

## 2018-11-04 DIAGNOSIS — Z780 Asymptomatic menopausal state: Secondary | ICD-10-CM | POA: Diagnosis not present

## 2018-11-04 DIAGNOSIS — M8588 Other specified disorders of bone density and structure, other site: Secondary | ICD-10-CM

## 2018-11-04 NOTE — Progress Notes (Signed)
      [facility] Counselor/Therapist Progress Note  Patient ID: Alexandra Mcgrath, MRN: [id number],    Date: 11/04/2018  Time Spent:  60 minutes Treatment Type: Individual Therapy  Reported Symptoms:   Anxiety has lessened a lot,  Uncertainty re: husband's cancer , doing much better  Mental Status Exam:  Appearance:   Casual     Behavior:  Appropriate and Sharing  Motor:  Normal  Speech/Language:   Clear and Coherent  Affect:  Congruent  Mood:  euthymic  Thought process:  normal  Thought content:    WNL  Sensory/Perceptual disturbances:    WNL  Orientation:  oriented to person, place, time/date, situation, day of week, month of year and year  Attention:  Good  Concentration:  Good  Memory:  WNL  Fund of knowledge:   Good  Insight:    Good  Judgment:   Good  Impulse Control:  Good   Risk Assessment: Danger to Self:  No Self-injurious Behavior: No Danger to Others: No Duty to Warn:no Physical Aggression / Violence:No  Access to Firearms a concern: No  Gang Involvement:No   Subjective:  Patient and husband to return to hospital for another treatment Feb. 6th.  Patient feeling more optimistic, better at staying in the present, much less worrying and looking now for what may go right that wrong. Followed  Up on all treatment recommendations and strategies. Had good trip away to see family near holidays "but came back to chemo treatments for husband."  On a 1-10 scale for fear and anxiety, I'm almost a "0".  Proud of the therapeutic steps she has taken and moving forwards.  Looked at some strategies that can continue to help patient manage symptoms  anxiety, depression, and fear if/when they re-occur. Calming exercises, setting limits, maintaining social contacts, prayers for her husband and herself, eating healthy, exercise, and positive thought patterns----all to continue.  Patient doing well right now and exercising much better self-care.     Interventions:  CBT and  Solution-Focused treatment  Diagnosis:   ICD-10-CM   1. Adjustment disorder with mixed anxiety and depressed mood F43.23     Plan:   To continue strategies that helped alleviate her symptoms .  Will call if needed.  No need to return right now.  Shanon Ace, LCSW

## 2018-11-11 DIAGNOSIS — M810 Age-related osteoporosis without current pathological fracture: Secondary | ICD-10-CM | POA: Diagnosis not present

## 2018-11-11 DIAGNOSIS — E538 Deficiency of other specified B group vitamins: Secondary | ICD-10-CM | POA: Diagnosis not present

## 2018-11-11 DIAGNOSIS — J0101 Acute recurrent maxillary sinusitis: Secondary | ICD-10-CM | POA: Diagnosis not present

## 2018-11-18 ENCOUNTER — Ambulatory Visit: Payer: Medicare Other | Admitting: Psychiatry

## 2019-01-11 DIAGNOSIS — R1032 Left lower quadrant pain: Secondary | ICD-10-CM | POA: Diagnosis not present

## 2019-03-31 DIAGNOSIS — L821 Other seborrheic keratosis: Secondary | ICD-10-CM | POA: Diagnosis not present

## 2019-03-31 DIAGNOSIS — W57XXXA Bitten or stung by nonvenomous insect and other nonvenomous arthropods, initial encounter: Secondary | ICD-10-CM | POA: Diagnosis not present

## 2019-03-31 DIAGNOSIS — L719 Rosacea, unspecified: Secondary | ICD-10-CM | POA: Diagnosis not present

## 2019-03-31 DIAGNOSIS — B029 Zoster without complications: Secondary | ICD-10-CM | POA: Diagnosis not present

## 2019-03-31 DIAGNOSIS — D2372 Other benign neoplasm of skin of left lower limb, including hip: Secondary | ICD-10-CM | POA: Diagnosis not present

## 2019-03-31 DIAGNOSIS — D2262 Melanocytic nevi of left upper limb, including shoulder: Secondary | ICD-10-CM | POA: Diagnosis not present

## 2019-03-31 DIAGNOSIS — D225 Melanocytic nevi of trunk: Secondary | ICD-10-CM | POA: Diagnosis not present

## 2019-03-31 DIAGNOSIS — S50861A Insect bite (nonvenomous) of right forearm, initial encounter: Secondary | ICD-10-CM | POA: Diagnosis not present

## 2019-03-31 DIAGNOSIS — D2371 Other benign neoplasm of skin of right lower limb, including hip: Secondary | ICD-10-CM | POA: Diagnosis not present

## 2019-03-31 DIAGNOSIS — D2261 Melanocytic nevi of right upper limb, including shoulder: Secondary | ICD-10-CM | POA: Diagnosis not present

## 2019-05-19 ENCOUNTER — Other Ambulatory Visit: Payer: Self-pay

## 2019-05-20 ENCOUNTER — Encounter: Payer: Self-pay | Admitting: Gynecology

## 2019-05-20 ENCOUNTER — Ambulatory Visit (INDEPENDENT_AMBULATORY_CARE_PROVIDER_SITE_OTHER): Payer: Medicare Other | Admitting: Gynecology

## 2019-05-20 VITALS — BP 120/74 | Ht 64.0 in | Wt 136.0 lb

## 2019-05-20 DIAGNOSIS — M810 Age-related osteoporosis without current pathological fracture: Secondary | ICD-10-CM | POA: Diagnosis not present

## 2019-05-20 DIAGNOSIS — Z01411 Encounter for gynecological examination (general) (routine) with abnormal findings: Secondary | ICD-10-CM | POA: Diagnosis not present

## 2019-05-20 DIAGNOSIS — R350 Frequency of micturition: Secondary | ICD-10-CM

## 2019-05-20 DIAGNOSIS — N952 Postmenopausal atrophic vaginitis: Secondary | ICD-10-CM

## 2019-05-20 DIAGNOSIS — N898 Other specified noninflammatory disorders of vagina: Secondary | ICD-10-CM

## 2019-05-20 DIAGNOSIS — Z1272 Encounter for screening for malignant neoplasm of vagina: Secondary | ICD-10-CM

## 2019-05-20 LAB — WET PREP FOR TRICH, YEAST, CLUE

## 2019-05-20 MED ORDER — TERCONAZOLE 0.4 % VA CREA: 1.0000 | TOPICAL_CREAM | Freq: Every day | VAGINAL | 0 refills | Status: DC

## 2019-05-20 NOTE — Patient Instructions (Signed)
Use the Terazol vaginal cream nightly for a week.  If the vaginal irritation continues and you want to initiate vaginal estrogen cream then call the office.  Follow-up with Dr. Felipa Eth to consider treatment for your osteoporosis.

## 2019-05-20 NOTE — Progress Notes (Signed)
Alexandra Mcgrath 22-Feb-1951 [id number]        68 y.o.  G2P2 presents complaining of several months of vaginal irritation and itching.  Used OTC antifungal 2 days ago externally.  No significant discharge.  Having some urinary frequency but no urgency, dysuria, low back pain, fever or chills.  It is been a number of years since she has had a GYN exam.  Status post hysterectomy in the past for benign indications.  Past medical history,surgical history, problem list, medications, allergies, family history and social history were all reviewed and documented as reviewed in the EPIC chart.  ROS:  Performed with pertinent positives and negatives included in the history, assessment and plan.   Additional significant findings : None   Exam: Caryn Bee assistant Vitals:   05/20/19 0837  BP: 120/74  Weight: 136 lb (61.7 kg)  Height: 5\' 4"  (1.626 m)   Body mass index is 23.34 kg/m.  General appearance:  Normal affect, orientation and appearance. Skin: Grossly normal HEENT: Without gross lesions.  No cervical or supraclavicular adenopathy. Thyroid normal.  Lungs:  Clear without wheezing, rales or rhonchi Cardiac: RR, without RMG Abdominal:  Soft, nontender, without masses, guarding, rebound, organomegaly or hernia Breasts:  Examined lying and sitting without masses, retractions, discharge or axillary adenopathy. Pelvic:  Ext, BUS, Vagina: With atrophic changes.  No significant discharge.  Pap smear done.  Wet prep done  Adnexa: Without masses or tenderness    Anus and perineum: Normal   Rectovaginal: Normal sphincter tone without palpated masses or tenderness.    Assessment/Plan:  68 y.o. G2P2 female with:  1. Vaginal irritation.  Wet prep was negative.  Discussed differential to include low-grade yeast infection noting symptoms have been for the last 1 to 2 months.  Also reviewed possibility of vaginal atrophy causing/contributing to her symptoms.  Had used HRT/vaginal estradiol in  the past but has not used this for several years.  Recommended to use Terazol vaginal cream now x1 week.  If symptoms persist despite this then to consider restarting vaginal estradiol cream through [facility] twice weekly.  Risks to include absorption with systemic effects such as thrombosis and breast discussed.  Recommended patient call me in several weeks if she is not better after Terazol. 2. Osteoporosis.  DEXA earlier this year T score -3.7.  Had discussed treatment options with Dr. Felipa Eth.  Decided against treatment at that time.  She asked my opinion.  She has osteoporosis at all measured sites.  I discussed her increased risk of fracture now and worsening risk of fracture as she ages.  Devastating sequela I with fracture reviewed.  Benefits of treatment to acutely decrease fracture risk as well as hinder ongoing calcium loss and decrease risk of fracture in the future also discussed.  Risks versus benefits of medication against disease reviewed.  She states that she has issues taking pills and we discussed there are alternatives such as Prolia.  I encouraged her to call Dr. Felipa Eth and rediscuss treatment options as she is been managing her bone health and strongly consider starting medication. 3. Mammography 10/2018.  Continue with annual mammography when due.  Breast exam normal today. 4. Colonoscopy 2018.  Repeat at their recommended interval. 5. Pap smear 2011.  Pap smear done today.  No history of abnormal Pap smears previously.  We discussed current screening guidelines and options to stop screening based on age and hysterectomy history reviewed. 6. Health maintenance.  No routine lab work done as  patient does this elsewhere.  Follow-up in response to Terazol cream and if she wants to start estradiol vaginal cream.  Follow-up in 1 year for annual exam  Additional time in excess of her breast and pelvic exam was spent in direct face to face counseling and coordination of care in  regards to her vaginal irritation and atrophic changes with prescription provided as well as discussion of her osteoporosis.Anastasio Auerbach MD, 9:03 AM 05/20/2019

## 2019-05-20 NOTE — Addendum Note (Signed)
Addended by: Nelva Nay on: 05/20/2019 10:17 AM   Modules accepted: Orders

## 2019-05-21 LAB — URINALYSIS, COMPLETE W/RFL CULTURE
Bacteria, UA: NONE SEEN /HPF
Bilirubin Urine: NEGATIVE
Glucose, UA: NEGATIVE
Hgb urine dipstick: NEGATIVE
Hyaline Cast: NONE SEEN /LPF
Ketones, ur: NEGATIVE
Leukocyte Esterase: NEGATIVE
Nitrites, Initial: NEGATIVE
Protein, ur: NEGATIVE
RBC / HPF: NONE SEEN /HPF (ref 0–2)
Specific Gravity, Urine: 1.015 (ref 1.001–1.03)
WBC, UA: NONE SEEN /HPF (ref 0–5)
pH: 7 (ref 5.0–8.0)

## 2019-05-21 LAB — PAP IG W/ RFLX HPV ASCU

## 2019-05-21 LAB — NO CULTURE INDICATED

## 2019-06-21 DIAGNOSIS — N390 Urinary tract infection, site not specified: Secondary | ICD-10-CM | POA: Diagnosis not present

## 2019-07-14 ENCOUNTER — Encounter: Payer: Self-pay | Admitting: Gynecology

## 2019-09-07 IMAGING — MG DIGITAL SCREENING BILATERAL MAMMOGRAM WITH TOMO AND CAD
8 series · 9 of 24 positions shown · non-contrast
Comparison: Previous exam(s).

CLINICAL DATA: Screening.

EXAM:
DIGITAL SCREENING BILATERAL MAMMOGRAM WITH TOMO AND CAD

[R MLO synth-2D]
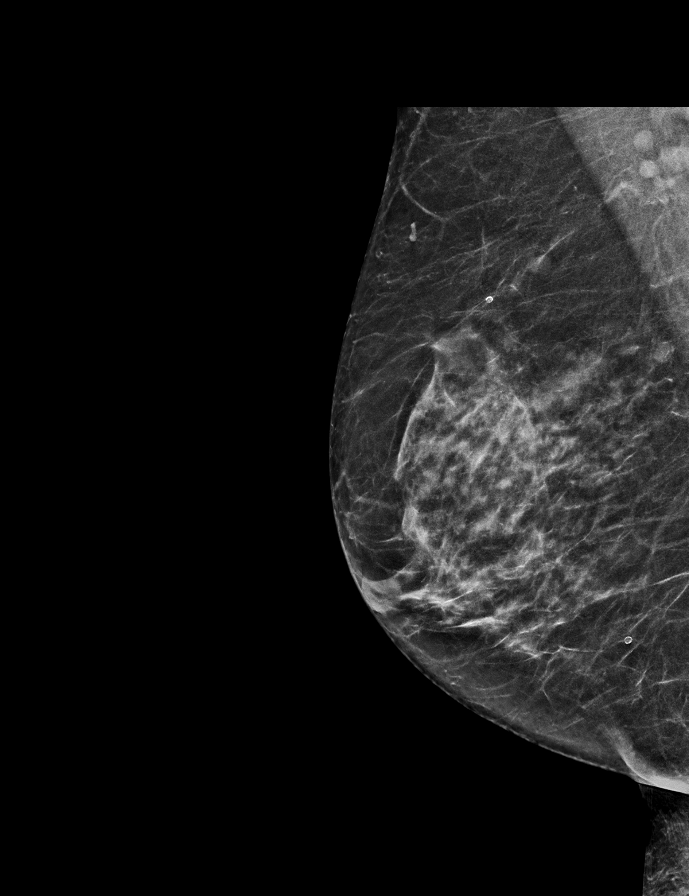

[L CC synth-2D]
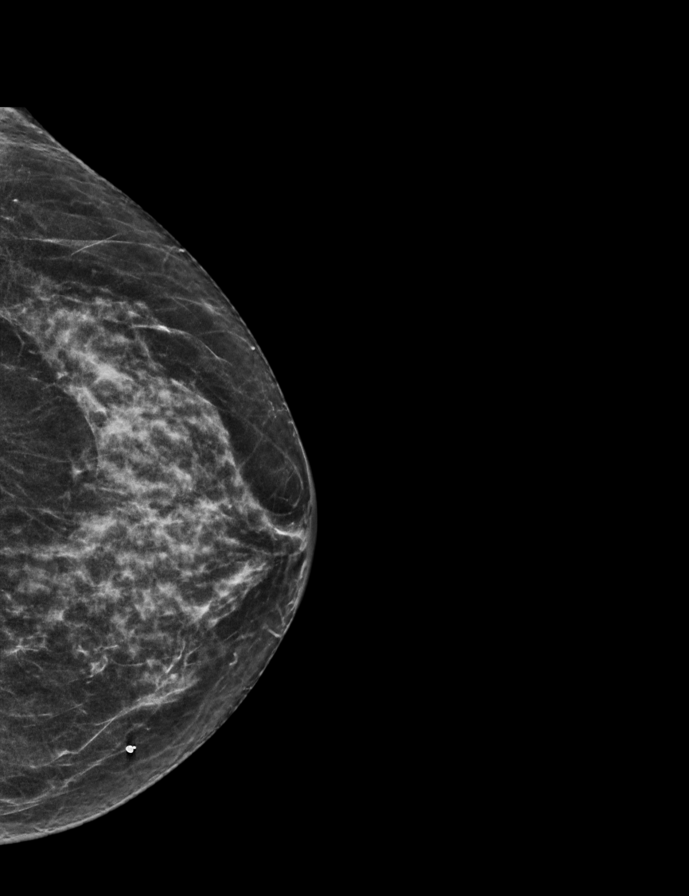

[R CC synth-2D]
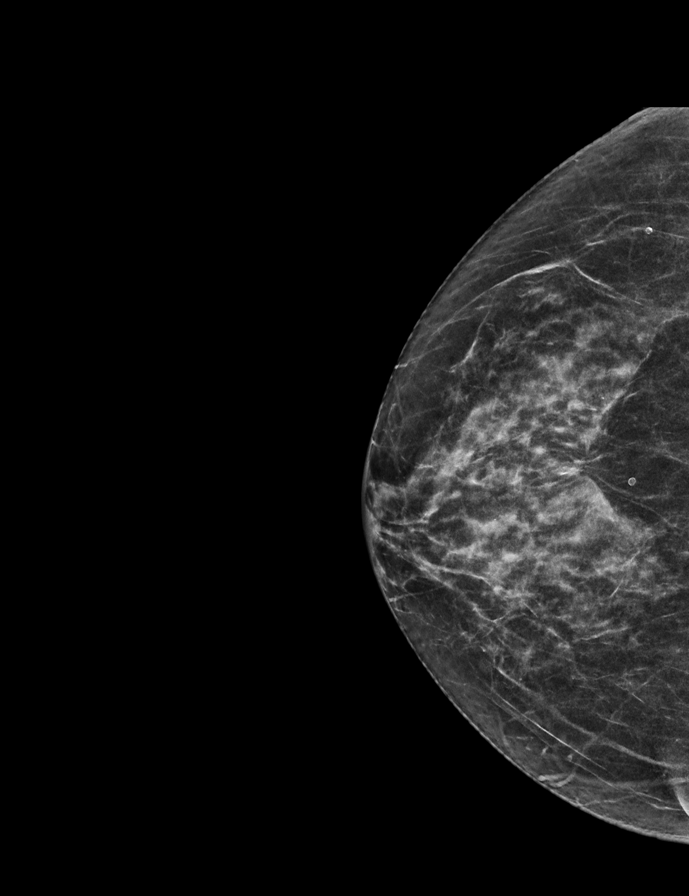

[L MLO synth-2D]
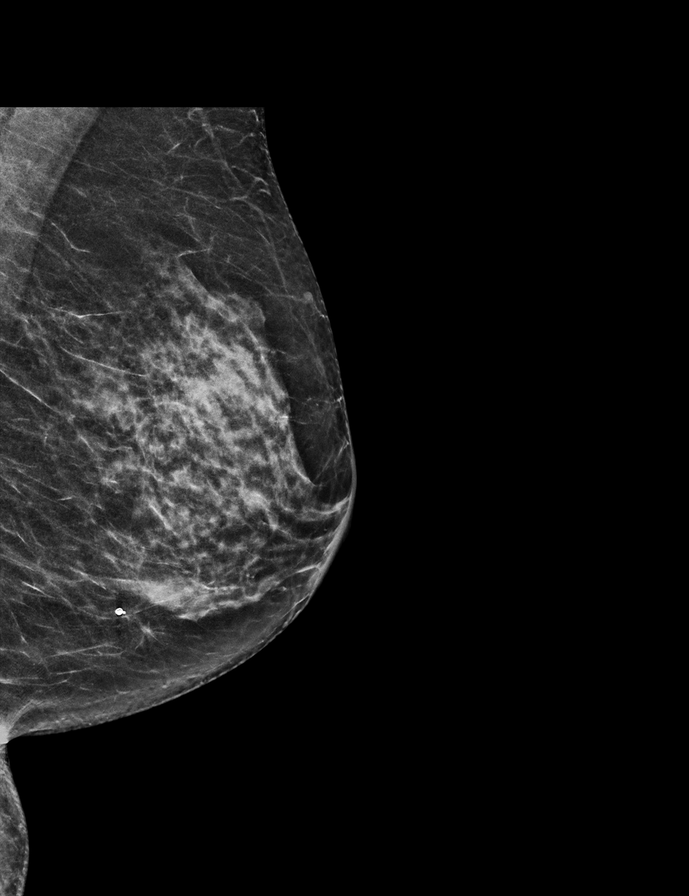

[R MLO tomo · 2 of 62 frames shown]
[frame 21/62]
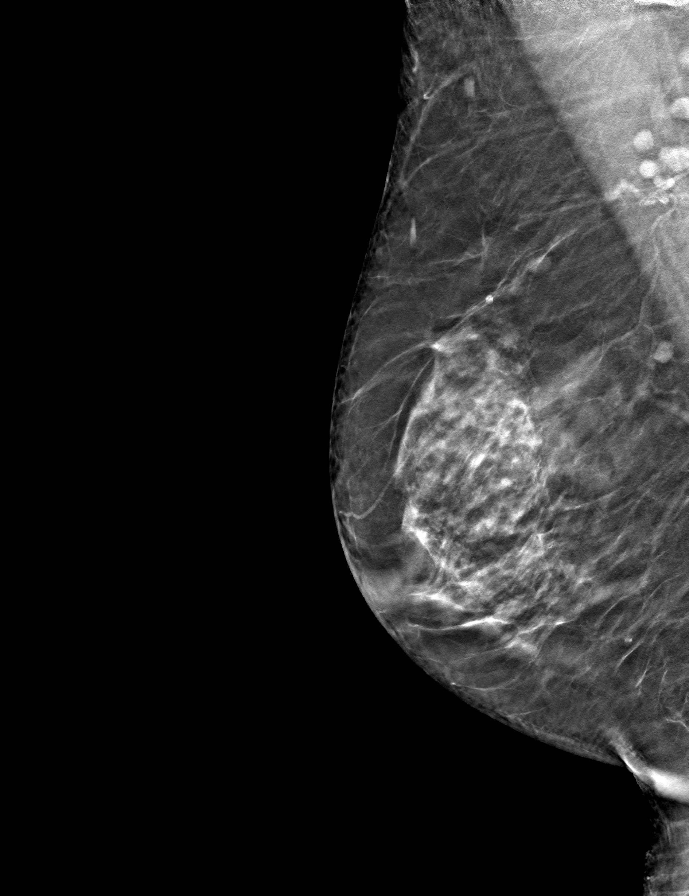
[frame 31/62]
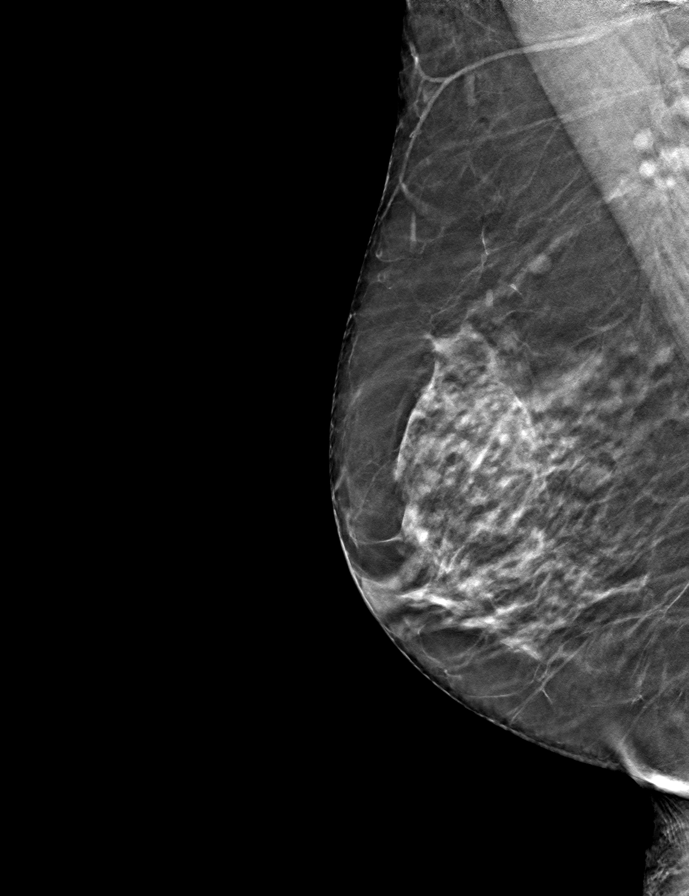

[L MLO tomo · tomo slice 28/55.0]
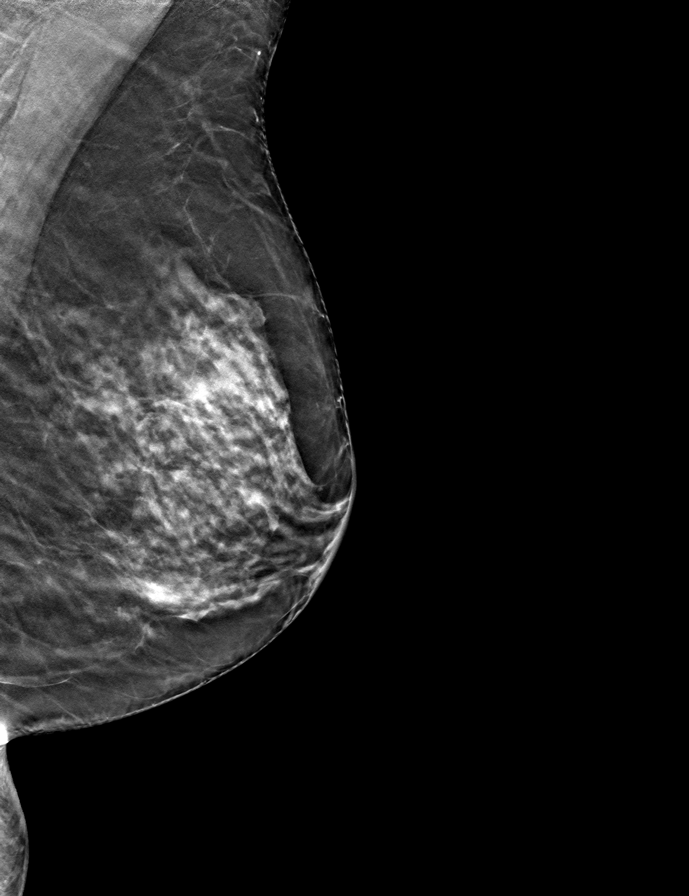

[R CC tomo · tomo slice 29/56.0]
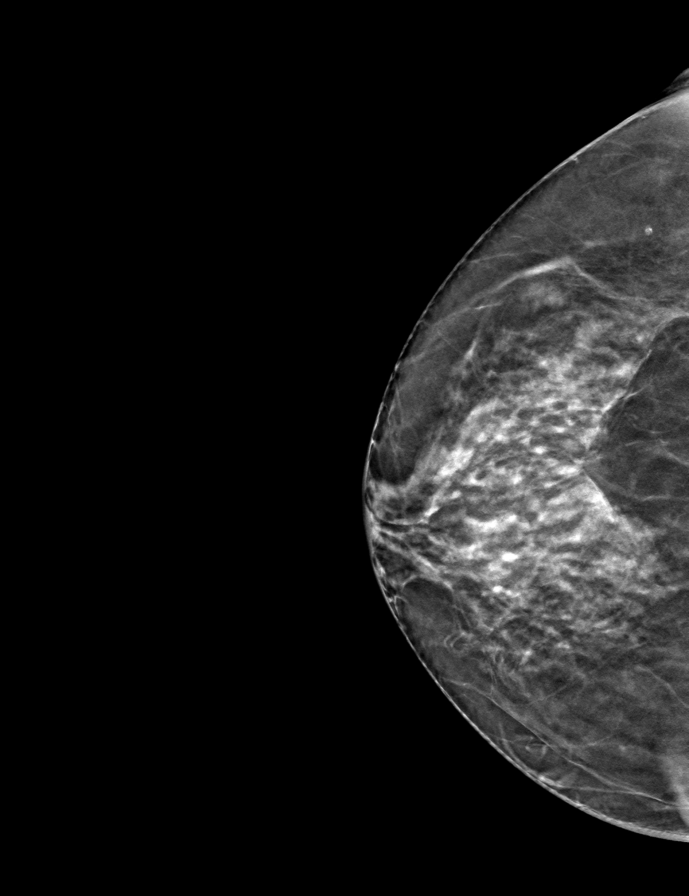

[L CC tomo · tomo slice 27/54.0]
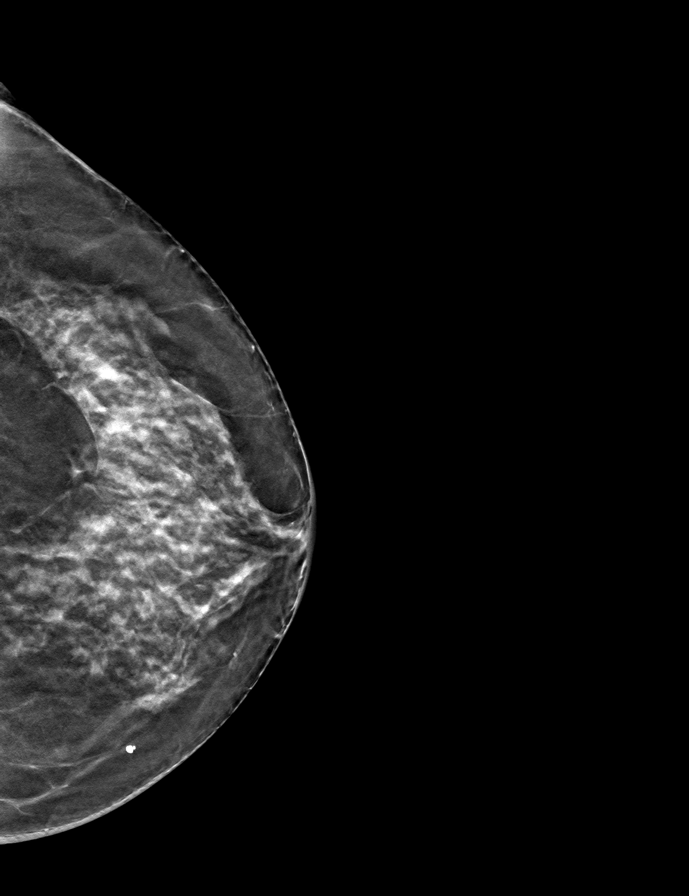

[9 of 24 positions shown; findings below may reference images not displayed]

ACR Breast Density Category c: The breast tissue is heterogeneously
dense, which may obscure small masses.
FINDINGS: There are no findings suspicious for malignancy. Images were
processed with CAD.
IMPRESSION: No mammographic evidence of malignancy. A result letter of this
screening mammogram will be mailed directly to the patient.

RECOMMENDATION:
Screening mammogram in one year. (Code:FT-U-LHB)

BI-RADS CATEGORY  1: Negative.

## 2019-10-14 DIAGNOSIS — Z03818 Encounter for observation for suspected exposure to other biological agents ruled out: Secondary | ICD-10-CM | POA: Diagnosis not present

## 2019-10-14 DIAGNOSIS — Z20828 Contact with and (suspected) exposure to other viral communicable diseases: Secondary | ICD-10-CM | POA: Diagnosis not present

## 2019-11-17 DIAGNOSIS — Z20828 Contact with and (suspected) exposure to other viral communicable diseases: Secondary | ICD-10-CM | POA: Diagnosis not present

## 2019-11-26 DIAGNOSIS — J0101 Acute recurrent maxillary sinusitis: Secondary | ICD-10-CM | POA: Diagnosis not present

## 2019-12-07 ENCOUNTER — Ambulatory Visit (INDEPENDENT_AMBULATORY_CARE_PROVIDER_SITE_OTHER): Payer: Medicare Other | Admitting: Psychiatry

## 2019-12-07 ENCOUNTER — Other Ambulatory Visit: Payer: Self-pay

## 2019-12-07 DIAGNOSIS — F4323 Adjustment disorder with mixed anxiety and depressed mood: Secondary | ICD-10-CM | POA: Diagnosis not present

## 2019-12-07 NOTE — Progress Notes (Signed)
[facility] Counselor/Therapist Progress Note  Patient ID: Alexandra Mcgrath, MRN: [id number],    Date: 12/07/2019  Time Spent: 50 minutes   3:10pm to 4:00pm  Treatment Type: Individual Therapy  Reported Symptoms: anxiety, depression, not sleeping as well, less energy  Mental Status Exam:  Appearance:   Casual     Behavior:  Appropriate and Sharing  Motor:  Normal  Speech/Language:   Clear and Coherent  Affect:  anxious, depression  Mood:  anxious and depressed  Thought process:  goal directed  Thought content:    WNL  Sensory/Perceptual disturbances:    WNL  Orientation:  oriented to person, place, time/date, situation, day of week, month of year and year  Attention:  Good  Concentration:  Good  Memory:  WNL  Fund of knowledge:   Good  Insight:    Good  Judgment:   Good  Impulse Control:  Good   Risk Assessment: Danger to Self:  No Self-injurious Behavior: No Danger to Others: No Duty to Warn:no Physical Aggression / Violence:No  Access to Firearms a concern: No  Gang Involvement:No   Subjective: Patient today coming for therapy after not being seen since 11/05/2018.  Her husband November 12, 2019.   Interventions: Cognitive Behavioral Therapy and Solution-Oriented/Positive Psychology  Diagnosis:   ICD-10-CM   1. Adjustment disorder with mixed anxiety and depressed mood  F43.23     Session Note: Patient in today after last being seen in November 05, 2018.  Patient reports she had been doing pretty well until her husband's cancer worsened and he died in 11/06/2019. She retired from her job. Son moved in with patient after his  father's death. Son's daughter there at times when she visits her dad.  Patient tearful intermittently and sharing what her journey has been like over the past year leading up to her husband's death. Currently feeling anxious, sad, depressed, and sometimes not sleeping well but does feel that it's getting some better "very gradually."  States that right now "I am just getting through each day" and processed her feelings of anxiety, fears, and grief. To focus on self-care of good nutrition, physical exercise, positive thought patterns, and staying in contact with family and friends. Denies any SI. Has supportive adult children and several grandchildren.  Plan: Patient not signing tx plan on computer screen due to Covid.  Treatment Goals: Goals remain on tx plan as patient works with strategies to meet her goals.  Progress will be noted each session in the "Progress" section of the tx plan.  Long Term Goal: Appropriately grieve the loss of her husband in order to normalize mood and to return to previous adaptive level of functioning.  Short term Goal: Identify and replace depressive thinking that leads to depressive feelings and actions.  Strategies: 1. Verbalize hopeful and positive statements about her future.1' 2. Make positive statements about self and ability to cope with the loss of husband and other stressors of life.  Progress: Patient is re-entering therapy today after last being seen in November 05, 2018.  Her husband died of cancer November 12, 2019 which contributed to her return to therapy.  We updated her goal plan today to better target her symptoms and issues she is addressing at this time.  Is motivated and welcomes intervention to help her cope and work through her grief and eventually move forward into the future more confidently.  Goal review and patient motivation and efforts noted with patient.  Next  appt within 2 weeks.  Shanon Ace, LCSW

## 2019-12-15 ENCOUNTER — Ambulatory Visit (INDEPENDENT_AMBULATORY_CARE_PROVIDER_SITE_OTHER): Payer: Medicare Other | Admitting: Psychiatry

## 2019-12-15 ENCOUNTER — Other Ambulatory Visit: Payer: Self-pay

## 2019-12-15 DIAGNOSIS — F4323 Adjustment disorder with mixed anxiety and depressed mood: Secondary | ICD-10-CM

## 2019-12-15 NOTE — Progress Notes (Signed)
      [facility] Counselor/Therapist Progress Note  Patient ID: Alexandra Mcgrath, MRN: [id number],    Date: 12/15/2019  Time Spent: 60 minutes   3:00pm to 4:00pm  Treatment Type: Individual Therapy  Reported Symptoms: sadness, anxiety, depression, frustration  Mental Status Exam:  Appearance:   Casual     Behavior:  Appropriate, Sharing and Motivated  Motor:  Normal  Speech/Language:   Normal Rate  Affect:  anxious  Mood:  sad, anxious, grieving  Thought process:  goal directed  Thought content:    WNL  Sensory/Perceptual disturbances:    WNL  Orientation:  oriented to person, place, time/date, situation, day of week, month of year and year  Attention:  Good  Concentration:  Good  Memory:  some forgetfulness (short term) and is sharing with her PCP  Fund of knowledge:   Good  Insight:    Good  Judgment:   Good  Impulse Control:  Good   Risk Assessment: Danger to Self:  No Self-injurious Behavior: No Danger to Others: No Duty to Warn:no Physical Aggression / Violence:No  Access to Firearms a concern: No  Gang Involvement:No   Subjective: Patient reports some "difficulty falling asleep but using Melatonin and that is good for me and I'm not concerned about that because when I get to sleep and it's good sleep".  Interventions: Cognitive Behavioral Therapy, Ego-Supportive and Grief Therapy  Diagnosis:   ICD-10-CM   1. Adjustment disorder with mixed anxiety and depressed mood  F43.23      Plan: Patient not signing tx plan on computer screen due to Covid.  Treatment Goals: Goals remain on tx plan as patient works with strategies to meet her goals.  Progress will be noted each session in the "Progress" section of the tx plan.  Long Term Goal: Appropriately grieve the loss of her husband in order to normalize mood and to return to previous adaptive level of functioning.  Short term Goal: Identify and replace depressive thinking that leads to depressive  feelings and actions.  Strategies: 1. Verbalize hopeful and positive statements about her future.1' 2. Make positive statements about self and ability to cope with the loss of husband and other stressors of life.  Progress: Patient working on her grief following husband's illness and death.  States tearfully "I'm sad, I'm really sad."  Neighbor's 27 yr old son also died 3 nights ago of an overdose which has added to her grief. Son currently staying with her and that is a help. Is goal-directed and identifying some of her depressive thinking and anxious thinking, and challenging them as to whether they are realistic and whether they are helpful, as she is discovering some are not very realistic at all and are definitely unhelpful to her in the midst of her grief.  Encouraged good self-care (physical and emotional), contact with friends/family, getting outside the house daily, healthy nutrition, and exercise.  Goal review and progress noted with patient.    Next appt within 2 weeks.   Shanon Ace, LCSW

## 2020-01-03 ENCOUNTER — Other Ambulatory Visit: Payer: Self-pay

## 2020-01-03 ENCOUNTER — Ambulatory Visit (INDEPENDENT_AMBULATORY_CARE_PROVIDER_SITE_OTHER): Payer: Medicare Other | Admitting: Psychiatry

## 2020-01-03 DIAGNOSIS — F4323 Adjustment disorder with mixed anxiety and depressed mood: Secondary | ICD-10-CM

## 2020-01-03 NOTE — Progress Notes (Signed)
      [facility] Counselor/Therapist Progress Note  Patient ID: Alexandra Mcgrath, MRN: [id number],    Date: 01/03/2020  Time Spent: 60 minutes   9:00am to 10:00am  Treatment Type: Individual Therapy  Reported Symptoms: anxiety, depression, sadness, some anger  Mental Status Exam:  Appearance:   Casual     Behavior:  Appropriate and Sharing  Motor:  Normal  Speech/Language:   Normal Rate  Affect:  anxious, depressed  Mood:  anxious, depressed and sad  Thought process:  normal  Thought content:    WNL  Sensory/Perceptual disturbances:    WNL  Orientation:  oriented to person, place, time/date, situation, day of week, month of year and year  Attention:  Good  Concentration:  Good  Memory:  forgets some under stress  Fund of knowledge:   Good  Insight:    Good  Judgment:   Good  Impulse Control:  Good   Risk Assessment: Danger to Self:  No Self-injurious Behavior: No Danger to Others: No Duty to Warn:no Physical Aggression / Violence:No  Access to Firearms a concern: No  Gang Involvement:No   Subjective: Patient in today with continued anxiety, grief, tearfulness, and depression as she continues to work on her grief over loss of husband. She and adult son had a disagreement recently.  Both are on edge due to their grief.   Interventions: Cognitive Behavioral Therapy and Grief Therapy  Diagnosis:   ICD-10-CM   1. Adjustment disorder with mixed anxiety and depressed mood  F43.23      Plan: Patient not signing tx plan on computer screen due to Covid.  Treatment Goals: Goals remain on tx plan as patient works with strategies to meet her goals. Progress will be noted each session in the "Progress" section of thetx plan.  Long Term Goal: Appropriately grieve the loss of her husband in order to normalize mood and to return to previous adaptive level of functioning.  Short term Goal: Identify and replace depressive thinking that leads to depressive feelings  and actions.  Strategies: 1. Verbalize hopeful and positive statements about her future.1' 2. Make positive statements about self and ability to cope with the loss of husband and other stressors of life.  Progress: Patient today continues work on her grief over loss of husband. Had disagreement with her adult son. Working on sadness, longing for husband, "moving slow in any progress but am moving."  More verbal today withe her grief which is good. Did a lot of processing her grief over losing husband.  Has begun to verbalize some hopeful and positive statements about her future "but it is hard".  Feeling "out of sorts" and mor accepting of this right now.  Seems to be better understanding some asepects of grief and that a lot of things are unpredictable.  To continue good self-care (physical and emotional), maintaining contact with friends, healthy nutrition, exercisek and being outside some each day,  Is going to beach with family within next couple weeks.  Goal review and progress noted with patient.  Next appt within 3 weeks.   Shanon Ace, LCSW

## 2020-01-19 DIAGNOSIS — Z20828 Contact with and (suspected) exposure to other viral communicable diseases: Secondary | ICD-10-CM | POA: Diagnosis not present

## 2020-01-19 DIAGNOSIS — Z03818 Encounter for observation for suspected exposure to other biological agents ruled out: Secondary | ICD-10-CM | POA: Diagnosis not present

## 2020-01-20 ENCOUNTER — Ambulatory Visit: Payer: Medicare Other | Admitting: Psychiatry

## 2020-07-03 DIAGNOSIS — J3089 Other allergic rhinitis: Secondary | ICD-10-CM | POA: Diagnosis not present

## 2020-07-25 DIAGNOSIS — R300 Dysuria: Secondary | ICD-10-CM | POA: Diagnosis not present

## 2020-07-25 DIAGNOSIS — Z6823 Body mass index (BMI) 23.0-23.9, adult: Secondary | ICD-10-CM | POA: Diagnosis not present

## 2020-07-25 DIAGNOSIS — Z124 Encounter for screening for malignant neoplasm of cervix: Secondary | ICD-10-CM | POA: Diagnosis not present

## 2020-07-25 DIAGNOSIS — N762 Acute vulvitis: Secondary | ICD-10-CM | POA: Diagnosis not present

## 2020-07-25 DIAGNOSIS — R32 Unspecified urinary incontinence: Secondary | ICD-10-CM | POA: Diagnosis not present

## 2020-07-25 DIAGNOSIS — N3945 Continuous leakage: Secondary | ICD-10-CM | POA: Diagnosis not present

## 2020-07-26 DIAGNOSIS — Z01419 Encounter for gynecological examination (general) (routine) without abnormal findings: Secondary | ICD-10-CM | POA: Diagnosis not present

## 2020-08-14 DIAGNOSIS — Z1321 Encounter for screening for nutritional disorder: Secondary | ICD-10-CM | POA: Diagnosis not present

## 2020-08-14 DIAGNOSIS — Z131 Encounter for screening for diabetes mellitus: Secondary | ICD-10-CM | POA: Diagnosis not present

## 2020-08-14 DIAGNOSIS — Z13220 Encounter for screening for lipoid disorders: Secondary | ICD-10-CM | POA: Diagnosis not present

## 2020-08-14 DIAGNOSIS — Z1329 Encounter for screening for other suspected endocrine disorder: Secondary | ICD-10-CM | POA: Diagnosis not present

## 2020-08-14 DIAGNOSIS — Z1231 Encounter for screening mammogram for malignant neoplasm of breast: Secondary | ICD-10-CM | POA: Diagnosis not present

## 2020-08-15 DIAGNOSIS — H938X3 Other specified disorders of ear, bilateral: Secondary | ICD-10-CM | POA: Diagnosis not present

## 2020-08-15 DIAGNOSIS — H93293 Other abnormal auditory perceptions, bilateral: Secondary | ICD-10-CM | POA: Diagnosis not present

## 2020-08-15 DIAGNOSIS — H903 Sensorineural hearing loss, bilateral: Secondary | ICD-10-CM | POA: Insufficient documentation

## 2020-08-15 DIAGNOSIS — H9313 Tinnitus, bilateral: Secondary | ICD-10-CM | POA: Diagnosis not present

## 2020-08-16 DIAGNOSIS — R159 Full incontinence of feces: Secondary | ICD-10-CM | POA: Diagnosis not present

## 2020-08-16 DIAGNOSIS — R3915 Urgency of urination: Secondary | ICD-10-CM | POA: Diagnosis not present

## 2020-08-16 DIAGNOSIS — M6281 Muscle weakness (generalized): Secondary | ICD-10-CM | POA: Diagnosis not present

## 2020-08-16 DIAGNOSIS — H903 Sensorineural hearing loss, bilateral: Secondary | ICD-10-CM | POA: Diagnosis not present

## 2020-08-16 DIAGNOSIS — N393 Stress incontinence (female) (male): Secondary | ICD-10-CM | POA: Diagnosis not present

## 2020-09-04 DIAGNOSIS — H2513 Age-related nuclear cataract, bilateral: Secondary | ICD-10-CM | POA: Diagnosis not present

## 2020-09-05 DIAGNOSIS — R159 Full incontinence of feces: Secondary | ICD-10-CM | POA: Diagnosis not present

## 2020-09-05 DIAGNOSIS — R3915 Urgency of urination: Secondary | ICD-10-CM | POA: Diagnosis not present

## 2020-09-05 DIAGNOSIS — M6281 Muscle weakness (generalized): Secondary | ICD-10-CM | POA: Diagnosis not present

## 2020-09-05 DIAGNOSIS — N393 Stress incontinence (female) (male): Secondary | ICD-10-CM | POA: Diagnosis not present

## 2020-09-12 DIAGNOSIS — R159 Full incontinence of feces: Secondary | ICD-10-CM | POA: Diagnosis not present

## 2020-09-12 DIAGNOSIS — M6281 Muscle weakness (generalized): Secondary | ICD-10-CM | POA: Diagnosis not present

## 2020-09-12 DIAGNOSIS — R3915 Urgency of urination: Secondary | ICD-10-CM | POA: Diagnosis not present

## 2020-09-12 DIAGNOSIS — N393 Stress incontinence (female) (male): Secondary | ICD-10-CM | POA: Diagnosis not present

## 2020-09-19 DIAGNOSIS — K219 Gastro-esophageal reflux disease without esophagitis: Secondary | ICD-10-CM | POA: Diagnosis not present

## 2020-09-19 DIAGNOSIS — Z79899 Other long term (current) drug therapy: Secondary | ICD-10-CM | POA: Diagnosis not present

## 2020-09-19 DIAGNOSIS — Z1389 Encounter for screening for other disorder: Secondary | ICD-10-CM | POA: Diagnosis not present

## 2020-09-19 DIAGNOSIS — Z0000 Encounter for general adult medical examination without abnormal findings: Secondary | ICD-10-CM | POA: Diagnosis not present

## 2020-09-19 DIAGNOSIS — D369 Benign neoplasm, unspecified site: Secondary | ICD-10-CM | POA: Diagnosis not present

## 2020-10-04 DIAGNOSIS — U071 COVID-19: Secondary | ICD-10-CM | POA: Diagnosis not present

## 2020-10-19 DIAGNOSIS — R159 Full incontinence of feces: Secondary | ICD-10-CM | POA: Diagnosis not present

## 2020-10-19 DIAGNOSIS — N393 Stress incontinence (female) (male): Secondary | ICD-10-CM | POA: Diagnosis not present

## 2020-10-19 DIAGNOSIS — M6281 Muscle weakness (generalized): Secondary | ICD-10-CM | POA: Diagnosis not present

## 2020-10-19 DIAGNOSIS — R3915 Urgency of urination: Secondary | ICD-10-CM | POA: Diagnosis not present

## 2020-10-31 DIAGNOSIS — M6281 Muscle weakness (generalized): Secondary | ICD-10-CM | POA: Diagnosis not present

## 2020-10-31 DIAGNOSIS — R159 Full incontinence of feces: Secondary | ICD-10-CM | POA: Diagnosis not present

## 2020-10-31 DIAGNOSIS — N393 Stress incontinence (female) (male): Secondary | ICD-10-CM | POA: Diagnosis not present

## 2020-10-31 DIAGNOSIS — R3915 Urgency of urination: Secondary | ICD-10-CM | POA: Diagnosis not present

## 2020-11-07 DIAGNOSIS — M6281 Muscle weakness (generalized): Secondary | ICD-10-CM | POA: Diagnosis not present

## 2020-11-07 DIAGNOSIS — R159 Full incontinence of feces: Secondary | ICD-10-CM | POA: Diagnosis not present

## 2020-11-07 DIAGNOSIS — N393 Stress incontinence (female) (male): Secondary | ICD-10-CM | POA: Diagnosis not present

## 2020-11-07 DIAGNOSIS — R3915 Urgency of urination: Secondary | ICD-10-CM | POA: Diagnosis not present

## 2020-11-14 DIAGNOSIS — R159 Full incontinence of feces: Secondary | ICD-10-CM | POA: Diagnosis not present

## 2020-11-14 DIAGNOSIS — R3915 Urgency of urination: Secondary | ICD-10-CM | POA: Diagnosis not present

## 2020-11-14 DIAGNOSIS — N393 Stress incontinence (female) (male): Secondary | ICD-10-CM | POA: Diagnosis not present

## 2020-11-14 DIAGNOSIS — M6281 Muscle weakness (generalized): Secondary | ICD-10-CM | POA: Diagnosis not present

## 2020-11-21 DIAGNOSIS — R159 Full incontinence of feces: Secondary | ICD-10-CM | POA: Diagnosis not present

## 2020-11-21 DIAGNOSIS — N393 Stress incontinence (female) (male): Secondary | ICD-10-CM | POA: Diagnosis not present

## 2020-11-21 DIAGNOSIS — R3915 Urgency of urination: Secondary | ICD-10-CM | POA: Diagnosis not present

## 2020-11-21 DIAGNOSIS — M6281 Muscle weakness (generalized): Secondary | ICD-10-CM | POA: Diagnosis not present

## 2020-11-29 DIAGNOSIS — R159 Full incontinence of feces: Secondary | ICD-10-CM | POA: Diagnosis not present

## 2020-11-29 DIAGNOSIS — R3915 Urgency of urination: Secondary | ICD-10-CM | POA: Diagnosis not present

## 2020-11-29 DIAGNOSIS — M6281 Muscle weakness (generalized): Secondary | ICD-10-CM | POA: Diagnosis not present

## 2020-11-29 DIAGNOSIS — N393 Stress incontinence (female) (male): Secondary | ICD-10-CM | POA: Diagnosis not present

## 2020-12-05 DIAGNOSIS — R350 Frequency of micturition: Secondary | ICD-10-CM | POA: Diagnosis not present

## 2020-12-19 DIAGNOSIS — R3915 Urgency of urination: Secondary | ICD-10-CM | POA: Diagnosis not present

## 2020-12-19 DIAGNOSIS — N393 Stress incontinence (female) (male): Secondary | ICD-10-CM | POA: Diagnosis not present

## 2020-12-19 DIAGNOSIS — M6281 Muscle weakness (generalized): Secondary | ICD-10-CM | POA: Diagnosis not present

## 2020-12-19 DIAGNOSIS — R159 Full incontinence of feces: Secondary | ICD-10-CM | POA: Diagnosis not present

## 2020-12-26 DIAGNOSIS — R159 Full incontinence of feces: Secondary | ICD-10-CM | POA: Diagnosis not present

## 2020-12-26 DIAGNOSIS — N393 Stress incontinence (female) (male): Secondary | ICD-10-CM | POA: Diagnosis not present

## 2020-12-26 DIAGNOSIS — R3915 Urgency of urination: Secondary | ICD-10-CM | POA: Diagnosis not present

## 2020-12-26 DIAGNOSIS — M6281 Muscle weakness (generalized): Secondary | ICD-10-CM | POA: Diagnosis not present

## 2021-01-02 DIAGNOSIS — R3915 Urgency of urination: Secondary | ICD-10-CM | POA: Diagnosis not present

## 2021-01-02 DIAGNOSIS — N393 Stress incontinence (female) (male): Secondary | ICD-10-CM | POA: Diagnosis not present

## 2021-01-02 DIAGNOSIS — M6281 Muscle weakness (generalized): Secondary | ICD-10-CM | POA: Diagnosis not present

## 2021-01-02 DIAGNOSIS — R159 Full incontinence of feces: Secondary | ICD-10-CM | POA: Diagnosis not present

## 2021-01-09 DIAGNOSIS — M6281 Muscle weakness (generalized): Secondary | ICD-10-CM | POA: Diagnosis not present

## 2021-01-09 DIAGNOSIS — R159 Full incontinence of feces: Secondary | ICD-10-CM | POA: Diagnosis not present

## 2021-01-09 DIAGNOSIS — N393 Stress incontinence (female) (male): Secondary | ICD-10-CM | POA: Diagnosis not present

## 2021-01-09 DIAGNOSIS — R3915 Urgency of urination: Secondary | ICD-10-CM | POA: Diagnosis not present

## 2021-01-23 DIAGNOSIS — M6281 Muscle weakness (generalized): Secondary | ICD-10-CM | POA: Diagnosis not present

## 2021-01-23 DIAGNOSIS — R3915 Urgency of urination: Secondary | ICD-10-CM | POA: Diagnosis not present

## 2021-01-23 DIAGNOSIS — N393 Stress incontinence (female) (male): Secondary | ICD-10-CM | POA: Diagnosis not present

## 2021-01-23 DIAGNOSIS — R159 Full incontinence of feces: Secondary | ICD-10-CM | POA: Diagnosis not present

## 2021-01-30 DIAGNOSIS — R3915 Urgency of urination: Secondary | ICD-10-CM | POA: Diagnosis not present

## 2021-01-30 DIAGNOSIS — N393 Stress incontinence (female) (male): Secondary | ICD-10-CM | POA: Diagnosis not present

## 2021-01-30 DIAGNOSIS — M6281 Muscle weakness (generalized): Secondary | ICD-10-CM | POA: Diagnosis not present

## 2021-01-30 DIAGNOSIS — R159 Full incontinence of feces: Secondary | ICD-10-CM | POA: Diagnosis not present

## 2021-02-06 DIAGNOSIS — R3915 Urgency of urination: Secondary | ICD-10-CM | POA: Diagnosis not present

## 2021-02-06 DIAGNOSIS — R159 Full incontinence of feces: Secondary | ICD-10-CM | POA: Diagnosis not present

## 2021-02-06 DIAGNOSIS — M6281 Muscle weakness (generalized): Secondary | ICD-10-CM | POA: Diagnosis not present

## 2021-02-06 DIAGNOSIS — N393 Stress incontinence (female) (male): Secondary | ICD-10-CM | POA: Diagnosis not present

## 2021-02-26 DIAGNOSIS — B351 Tinea unguium: Secondary | ICD-10-CM | POA: Diagnosis not present

## 2021-03-06 DIAGNOSIS — N393 Stress incontinence (female) (male): Secondary | ICD-10-CM | POA: Diagnosis not present

## 2021-03-06 DIAGNOSIS — M6281 Muscle weakness (generalized): Secondary | ICD-10-CM | POA: Diagnosis not present

## 2021-03-06 DIAGNOSIS — R3915 Urgency of urination: Secondary | ICD-10-CM | POA: Diagnosis not present

## 2021-03-06 DIAGNOSIS — R159 Full incontinence of feces: Secondary | ICD-10-CM | POA: Diagnosis not present

## 2021-03-08 DIAGNOSIS — L603 Nail dystrophy: Secondary | ICD-10-CM | POA: Diagnosis not present

## 2021-03-27 DIAGNOSIS — M6281 Muscle weakness (generalized): Secondary | ICD-10-CM | POA: Diagnosis not present

## 2021-03-27 DIAGNOSIS — N393 Stress incontinence (female) (male): Secondary | ICD-10-CM | POA: Diagnosis not present

## 2021-03-27 DIAGNOSIS — R3915 Urgency of urination: Secondary | ICD-10-CM | POA: Diagnosis not present

## 2021-03-27 DIAGNOSIS — R159 Full incontinence of feces: Secondary | ICD-10-CM | POA: Diagnosis not present

## 2021-04-19 DIAGNOSIS — M6281 Muscle weakness (generalized): Secondary | ICD-10-CM | POA: Diagnosis not present

## 2021-04-19 DIAGNOSIS — N393 Stress incontinence (female) (male): Secondary | ICD-10-CM | POA: Diagnosis not present

## 2021-04-19 DIAGNOSIS — R159 Full incontinence of feces: Secondary | ICD-10-CM | POA: Diagnosis not present

## 2021-04-19 DIAGNOSIS — R3915 Urgency of urination: Secondary | ICD-10-CM | POA: Diagnosis not present

## 2021-04-26 DIAGNOSIS — N393 Stress incontinence (female) (male): Secondary | ICD-10-CM | POA: Diagnosis not present

## 2021-04-26 DIAGNOSIS — R159 Full incontinence of feces: Secondary | ICD-10-CM | POA: Diagnosis not present

## 2021-04-26 DIAGNOSIS — R3915 Urgency of urination: Secondary | ICD-10-CM | POA: Diagnosis not present

## 2021-04-26 DIAGNOSIS — M6281 Muscle weakness (generalized): Secondary | ICD-10-CM | POA: Diagnosis not present

## 2021-05-01 DIAGNOSIS — N393 Stress incontinence (female) (male): Secondary | ICD-10-CM | POA: Diagnosis not present

## 2021-05-01 DIAGNOSIS — R3915 Urgency of urination: Secondary | ICD-10-CM | POA: Diagnosis not present

## 2021-05-01 DIAGNOSIS — R159 Full incontinence of feces: Secondary | ICD-10-CM | POA: Diagnosis not present

## 2021-05-01 DIAGNOSIS — M6281 Muscle weakness (generalized): Secondary | ICD-10-CM | POA: Diagnosis not present

## 2021-05-10 DIAGNOSIS — Z20822 Contact with and (suspected) exposure to covid-19: Secondary | ICD-10-CM | POA: Diagnosis not present

## 2021-05-17 DIAGNOSIS — N393 Stress incontinence (female) (male): Secondary | ICD-10-CM | POA: Diagnosis not present

## 2021-05-17 DIAGNOSIS — R159 Full incontinence of feces: Secondary | ICD-10-CM | POA: Diagnosis not present

## 2021-05-17 DIAGNOSIS — M6281 Muscle weakness (generalized): Secondary | ICD-10-CM | POA: Diagnosis not present

## 2021-05-17 DIAGNOSIS — R3915 Urgency of urination: Secondary | ICD-10-CM | POA: Diagnosis not present

## 2021-05-22 DIAGNOSIS — M6281 Muscle weakness (generalized): Secondary | ICD-10-CM | POA: Diagnosis not present

## 2021-05-22 DIAGNOSIS — R159 Full incontinence of feces: Secondary | ICD-10-CM | POA: Diagnosis not present

## 2021-05-22 DIAGNOSIS — R3915 Urgency of urination: Secondary | ICD-10-CM | POA: Diagnosis not present

## 2021-05-22 DIAGNOSIS — N393 Stress incontinence (female) (male): Secondary | ICD-10-CM | POA: Diagnosis not present

## 2021-05-28 DIAGNOSIS — R3989 Other symptoms and signs involving the genitourinary system: Secondary | ICD-10-CM | POA: Diagnosis not present

## 2021-05-31 DIAGNOSIS — R3915 Urgency of urination: Secondary | ICD-10-CM | POA: Diagnosis not present

## 2021-05-31 DIAGNOSIS — N393 Stress incontinence (female) (male): Secondary | ICD-10-CM | POA: Diagnosis not present

## 2021-05-31 DIAGNOSIS — M6281 Muscle weakness (generalized): Secondary | ICD-10-CM | POA: Diagnosis not present

## 2021-05-31 DIAGNOSIS — R159 Full incontinence of feces: Secondary | ICD-10-CM | POA: Diagnosis not present

## 2021-06-13 DIAGNOSIS — M6281 Muscle weakness (generalized): Secondary | ICD-10-CM | POA: Diagnosis not present

## 2021-06-13 DIAGNOSIS — N393 Stress incontinence (female) (male): Secondary | ICD-10-CM | POA: Diagnosis not present

## 2021-06-13 DIAGNOSIS — R159 Full incontinence of feces: Secondary | ICD-10-CM | POA: Diagnosis not present

## 2021-06-13 DIAGNOSIS — R3915 Urgency of urination: Secondary | ICD-10-CM | POA: Diagnosis not present

## 2021-06-26 DIAGNOSIS — R5383 Other fatigue: Secondary | ICD-10-CM | POA: Diagnosis not present

## 2021-06-26 DIAGNOSIS — M791 Myalgia, unspecified site: Secondary | ICD-10-CM | POA: Diagnosis not present

## 2021-06-26 DIAGNOSIS — R300 Dysuria: Secondary | ICD-10-CM | POA: Diagnosis not present

## 2021-06-26 DIAGNOSIS — Z79899 Other long term (current) drug therapy: Secondary | ICD-10-CM | POA: Diagnosis not present

## 2021-07-02 DIAGNOSIS — L570 Actinic keratosis: Secondary | ICD-10-CM | POA: Diagnosis not present

## 2021-07-02 DIAGNOSIS — D2371 Other benign neoplasm of skin of right lower limb, including hip: Secondary | ICD-10-CM | POA: Diagnosis not present

## 2021-07-02 DIAGNOSIS — L578 Other skin changes due to chronic exposure to nonionizing radiation: Secondary | ICD-10-CM | POA: Diagnosis not present

## 2021-07-02 DIAGNOSIS — L814 Other melanin hyperpigmentation: Secondary | ICD-10-CM | POA: Diagnosis not present

## 2021-07-02 DIAGNOSIS — L601 Onycholysis: Secondary | ICD-10-CM | POA: Diagnosis not present

## 2021-07-02 DIAGNOSIS — D2372 Other benign neoplasm of skin of left lower limb, including hip: Secondary | ICD-10-CM | POA: Diagnosis not present

## 2021-07-02 DIAGNOSIS — L821 Other seborrheic keratosis: Secondary | ICD-10-CM | POA: Diagnosis not present

## 2021-07-02 DIAGNOSIS — D225 Melanocytic nevi of trunk: Secondary | ICD-10-CM | POA: Diagnosis not present

## 2021-07-02 DIAGNOSIS — D2262 Melanocytic nevi of left upper limb, including shoulder: Secondary | ICD-10-CM | POA: Diagnosis not present

## 2021-07-11 DIAGNOSIS — R159 Full incontinence of feces: Secondary | ICD-10-CM | POA: Diagnosis not present

## 2021-07-11 DIAGNOSIS — M6281 Muscle weakness (generalized): Secondary | ICD-10-CM | POA: Diagnosis not present

## 2021-07-11 DIAGNOSIS — N393 Stress incontinence (female) (male): Secondary | ICD-10-CM | POA: Diagnosis not present

## 2021-07-11 DIAGNOSIS — R300 Dysuria: Secondary | ICD-10-CM | POA: Diagnosis not present

## 2021-07-11 DIAGNOSIS — R7401 Elevation of levels of liver transaminase levels: Secondary | ICD-10-CM | POA: Diagnosis not present

## 2021-07-11 DIAGNOSIS — R3915 Urgency of urination: Secondary | ICD-10-CM | POA: Diagnosis not present

## 2021-07-25 DIAGNOSIS — R3915 Urgency of urination: Secondary | ICD-10-CM | POA: Diagnosis not present

## 2021-07-25 DIAGNOSIS — M6281 Muscle weakness (generalized): Secondary | ICD-10-CM | POA: Diagnosis not present

## 2021-07-25 DIAGNOSIS — N393 Stress incontinence (female) (male): Secondary | ICD-10-CM | POA: Diagnosis not present

## 2021-07-25 DIAGNOSIS — R159 Full incontinence of feces: Secondary | ICD-10-CM | POA: Diagnosis not present

## 2021-08-08 DIAGNOSIS — M6281 Muscle weakness (generalized): Secondary | ICD-10-CM | POA: Diagnosis not present

## 2021-08-08 DIAGNOSIS — R3915 Urgency of urination: Secondary | ICD-10-CM | POA: Diagnosis not present

## 2021-08-08 DIAGNOSIS — R159 Full incontinence of feces: Secondary | ICD-10-CM | POA: Diagnosis not present

## 2021-08-08 DIAGNOSIS — N393 Stress incontinence (female) (male): Secondary | ICD-10-CM | POA: Diagnosis not present

## 2021-09-03 DIAGNOSIS — H40013 Open angle with borderline findings, low risk, bilateral: Secondary | ICD-10-CM | POA: Diagnosis not present

## 2021-09-17 DIAGNOSIS — R3121 Asymptomatic microscopic hematuria: Secondary | ICD-10-CM | POA: Diagnosis not present

## 2021-09-21 DIAGNOSIS — Z7689 Persons encountering health services in other specified circumstances: Secondary | ICD-10-CM | POA: Diagnosis not present

## 2021-09-21 DIAGNOSIS — Z6822 Body mass index (BMI) 22.0-22.9, adult: Secondary | ICD-10-CM | POA: Diagnosis not present

## 2021-09-21 DIAGNOSIS — Z01419 Encounter for gynecological examination (general) (routine) without abnormal findings: Secondary | ICD-10-CM | POA: Diagnosis not present

## 2021-09-21 DIAGNOSIS — N3000 Acute cystitis without hematuria: Secondary | ICD-10-CM | POA: Diagnosis not present

## 2021-09-21 DIAGNOSIS — N3946 Mixed incontinence: Secondary | ICD-10-CM | POA: Diagnosis not present

## 2021-09-21 DIAGNOSIS — N952 Postmenopausal atrophic vaginitis: Secondary | ICD-10-CM | POA: Diagnosis not present

## 2021-09-21 DIAGNOSIS — Z1231 Encounter for screening mammogram for malignant neoplasm of breast: Secondary | ICD-10-CM | POA: Diagnosis not present

## 2021-10-09 DIAGNOSIS — M810 Age-related osteoporosis without current pathological fracture: Secondary | ICD-10-CM | POA: Diagnosis not present

## 2021-10-09 DIAGNOSIS — K219 Gastro-esophageal reflux disease without esophagitis: Secondary | ICD-10-CM | POA: Diagnosis not present

## 2021-10-09 DIAGNOSIS — D369 Benign neoplasm, unspecified site: Secondary | ICD-10-CM | POA: Diagnosis not present

## 2021-10-09 DIAGNOSIS — Z136 Encounter for screening for cardiovascular disorders: Secondary | ICD-10-CM | POA: Diagnosis not present

## 2021-10-09 DIAGNOSIS — Z0000 Encounter for general adult medical examination without abnormal findings: Secondary | ICD-10-CM | POA: Diagnosis not present

## 2021-10-09 DIAGNOSIS — Z1389 Encounter for screening for other disorder: Secondary | ICD-10-CM | POA: Diagnosis not present

## 2021-10-09 DIAGNOSIS — Z79899 Other long term (current) drug therapy: Secondary | ICD-10-CM | POA: Diagnosis not present

## 2021-10-09 DIAGNOSIS — E739 Lactose intolerance, unspecified: Secondary | ICD-10-CM | POA: Diagnosis not present

## 2021-10-12 DIAGNOSIS — K6389 Other specified diseases of intestine: Secondary | ICD-10-CM | POA: Diagnosis not present

## 2021-10-12 DIAGNOSIS — R3129 Other microscopic hematuria: Secondary | ICD-10-CM | POA: Diagnosis not present

## 2021-10-12 DIAGNOSIS — R3121 Asymptomatic microscopic hematuria: Secondary | ICD-10-CM | POA: Diagnosis not present

## 2021-10-12 DIAGNOSIS — K3189 Other diseases of stomach and duodenum: Secondary | ICD-10-CM | POA: Diagnosis not present

## 2021-10-12 DIAGNOSIS — K7689 Other specified diseases of liver: Secondary | ICD-10-CM | POA: Diagnosis not present

## 2021-10-17 DIAGNOSIS — R3915 Urgency of urination: Secondary | ICD-10-CM | POA: Diagnosis not present

## 2021-10-17 DIAGNOSIS — N393 Stress incontinence (female) (male): Secondary | ICD-10-CM | POA: Diagnosis not present

## 2021-10-17 DIAGNOSIS — K5909 Other constipation: Secondary | ICD-10-CM | POA: Diagnosis not present

## 2021-10-30 DIAGNOSIS — N393 Stress incontinence (female) (male): Secondary | ICD-10-CM | POA: Diagnosis not present

## 2021-10-30 DIAGNOSIS — K5909 Other constipation: Secondary | ICD-10-CM | POA: Diagnosis not present

## 2021-10-30 DIAGNOSIS — R3915 Urgency of urination: Secondary | ICD-10-CM | POA: Diagnosis not present

## 2021-11-01 DIAGNOSIS — R208 Other disturbances of skin sensation: Secondary | ICD-10-CM | POA: Diagnosis not present

## 2021-11-01 DIAGNOSIS — N760 Acute vaginitis: Secondary | ICD-10-CM | POA: Diagnosis not present

## 2021-11-01 DIAGNOSIS — A609 Anogenital herpesviral infection, unspecified: Secondary | ICD-10-CM | POA: Diagnosis not present

## 2021-11-07 DIAGNOSIS — K5909 Other constipation: Secondary | ICD-10-CM | POA: Diagnosis not present

## 2021-11-07 DIAGNOSIS — N393 Stress incontinence (female) (male): Secondary | ICD-10-CM | POA: Diagnosis not present

## 2021-11-07 DIAGNOSIS — R3915 Urgency of urination: Secondary | ICD-10-CM | POA: Diagnosis not present

## 2021-11-13 DIAGNOSIS — K5909 Other constipation: Secondary | ICD-10-CM | POA: Diagnosis not present

## 2021-11-13 DIAGNOSIS — R3915 Urgency of urination: Secondary | ICD-10-CM | POA: Diagnosis not present

## 2021-11-13 DIAGNOSIS — N393 Stress incontinence (female) (male): Secondary | ICD-10-CM | POA: Diagnosis not present

## 2021-11-19 DIAGNOSIS — R3121 Asymptomatic microscopic hematuria: Secondary | ICD-10-CM | POA: Diagnosis not present

## 2021-11-19 DIAGNOSIS — R350 Frequency of micturition: Secondary | ICD-10-CM | POA: Diagnosis not present

## 2021-12-04 DIAGNOSIS — N393 Stress incontinence (female) (male): Secondary | ICD-10-CM | POA: Diagnosis not present

## 2021-12-04 DIAGNOSIS — K5909 Other constipation: Secondary | ICD-10-CM | POA: Diagnosis not present

## 2021-12-04 DIAGNOSIS — R3915 Urgency of urination: Secondary | ICD-10-CM | POA: Diagnosis not present

## 2021-12-25 DIAGNOSIS — K5909 Other constipation: Secondary | ICD-10-CM | POA: Diagnosis not present

## 2021-12-25 DIAGNOSIS — R3915 Urgency of urination: Secondary | ICD-10-CM | POA: Diagnosis not present

## 2021-12-25 DIAGNOSIS — N393 Stress incontinence (female) (male): Secondary | ICD-10-CM | POA: Diagnosis not present

## 2022-01-28 DIAGNOSIS — R350 Frequency of micturition: Secondary | ICD-10-CM | POA: Diagnosis not present

## 2022-01-28 DIAGNOSIS — N3000 Acute cystitis without hematuria: Secondary | ICD-10-CM | POA: Diagnosis not present

## 2022-02-07 LAB — LAB REPORT - SCANNED
A1c: 5.6
EGFR: 78

## 2022-02-26 DIAGNOSIS — N760 Acute vaginitis: Secondary | ICD-10-CM | POA: Diagnosis not present

## 2022-02-26 DIAGNOSIS — N952 Postmenopausal atrophic vaginitis: Secondary | ICD-10-CM | POA: Diagnosis not present

## 2022-03-18 DIAGNOSIS — N760 Acute vaginitis: Secondary | ICD-10-CM | POA: Diagnosis not present

## 2022-04-01 DIAGNOSIS — H1033 Unspecified acute conjunctivitis, bilateral: Secondary | ICD-10-CM | POA: Diagnosis not present

## 2022-04-01 DIAGNOSIS — J069 Acute upper respiratory infection, unspecified: Secondary | ICD-10-CM | POA: Diagnosis not present

## 2022-04-30 DIAGNOSIS — R946 Abnormal results of thyroid function studies: Secondary | ICD-10-CM | POA: Diagnosis not present

## 2022-04-30 DIAGNOSIS — M79604 Pain in right leg: Secondary | ICD-10-CM | POA: Diagnosis not present

## 2022-04-30 DIAGNOSIS — R7989 Other specified abnormal findings of blood chemistry: Secondary | ICD-10-CM | POA: Diagnosis not present

## 2022-04-30 DIAGNOSIS — R739 Hyperglycemia, unspecified: Secondary | ICD-10-CM | POA: Diagnosis not present

## 2022-11-05 ENCOUNTER — Encounter: Payer: Self-pay | Admitting: Nurse Practitioner

## 2022-11-05 ENCOUNTER — Ambulatory Visit: Payer: Medicare HMO | Admitting: Nurse Practitioner

## 2022-11-05 VITALS — BP 130/80 | HR 90 | Ht 63.0 in | Wt 126.8 lb

## 2022-11-05 DIAGNOSIS — Z0000 Encounter for general adult medical examination without abnormal findings: Secondary | ICD-10-CM | POA: Diagnosis not present

## 2022-11-05 DIAGNOSIS — E063 Autoimmune thyroiditis: Secondary | ICD-10-CM

## 2022-11-05 DIAGNOSIS — N898 Other specified noninflammatory disorders of vagina: Secondary | ICD-10-CM

## 2022-11-05 DIAGNOSIS — R5383 Other fatigue: Secondary | ICD-10-CM

## 2022-11-05 DIAGNOSIS — E785 Hyperlipidemia, unspecified: Secondary | ICD-10-CM | POA: Insufficient documentation

## 2022-11-05 DIAGNOSIS — D729 Disorder of white blood cells, unspecified: Secondary | ICD-10-CM

## 2022-11-05 DIAGNOSIS — E782 Mixed hyperlipidemia: Secondary | ICD-10-CM

## 2022-11-05 HISTORY — DX: Encounter for general adult medical examination without abnormal findings: Z00.00

## 2022-11-05 NOTE — Patient Instructions (Addendum)
I will get things updated in your chart.   I recommend looking up Hashimoto's. This is a good website: [email or web address]

## 2022-11-05 NOTE — Progress Notes (Signed)
Orma Render, DNP, AGNP-c [facility] [address] [place], [place] [address] [place] [phone]   New patient visit   Patient: Alexandra Mcgrath   DOB: [birth year]   72 y.o. Female  MRN: [id number] Visit Date: 11/05/2022  Patient Care Team: Orma Render, NP as PCP - General (Nurse Practitioner)  Today's Vitals   11/05/22 1109  BP: 130/80  Pulse: 90  Weight: 126 lb 12.8 oz (57.5 kg)  Height: '5\' 3"'$  (1.6 m)   Body mass index is 22.46 kg/m.   Today's healthcare provider: Orma Render, NP   Chief Complaint  Patient presents with   Establish Care    New patient to establish care. Had a colonoscopy about 2 weeks ago and she is still recovering. Had 7 polyps removed and she feels like she had a major surgery, lots of trapped gas. Dr. Paulita Fujita @ [place]. Has ongoing allergies, cat sleeps with her. Has ongoing sleep issues. Thinks she has shingles last year but also had some vaginal issue-responded to valtrex so they can't really prove what she had vaginally, still deals with lesions. She also would like to know if you treat patients with Lyme disease?   Subjective    Alexandra Mcgrath is a 72 y.o. female who presents today as a new patient to establish care.    Patient endorses the following concerns presently: Vaginal Lesions, Possible Lyme Disease, Labs with Integrative [facility]  Vaginal Lesions Alexandra Mcgrath tells me that a year ago this month she developed a very painful rash with lesions inside the vagina. She was seen by GYN, but her testing was unrevealing. She was started on valacyclovir and topical cream for management and the lesions cleared. She tells me that since that time she has had recurrence of the lesions several times. She reports that the subsequent recurrences are not as painful and there are less lesions than during the original outbreak. The pelvic floor physical therapist at [facility], where she was going at the time of the  initial outbreak, told her that shingles were very common inside the vagina. She feels she may be having recurrence of shingles outbreaks. She will be seeing her GYN, Dr. Marylynn Pearson, tomorrow and plans to discuss this with her.   Lyme Halo tells me that she suffers from periods of significant fatigue, that can fluctuate on a daily basis. She reports this has been present for many years. She reports that any time she expends energy she is completed depleted the next day. She also experiences joint pains. She tells me that her functional medicine doctor performed a muscle test on her and this showed possible infection with the bacteria that causes lyme disease. She would like to know if it is possible to test her for this.  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 She tells me she often does not have the energy to get started in the mornings, but come late night she is unable to settle down and will work late into the night. She is not sure if the stress and lack of sleep are contributing to her fatigue, but she reports she does feel more tired after not sleeping well.   [facility] is seeing Alexandra Mcgrath, DC for integrative medicine services to help manage her supplements. She had labs completed at his office in May 2023 which showed elevated cholesterol, elevation in lymphocytes  and monocytes with decreased overall WBC, low total protein, elevated iron and ferritin, elevated T4, positive Thyroglobulin antibodies, an high magnesium. In the past she has been treated for hypothyroidism with armour thyroid, but this has been many years ago. She requests that I review the labs. She tells me that he has not made a decision on what to do about her abnormal labs. They will plan to repeat her labs in May of this year. Insurance does not cover these labs.   [place] tells me she recently had a colonoscopy and she did not  do well after the procedure. She tells me that this was her 5th colonoscopy and she has never had the same symptoms in the past. She reports awakening with severe gas pains in her abdomen and chest and continuation of the pains for 2 weeks. She tells me at this time the pain is improving, but she was surprised by the amount of symptoms she had this time.    History reviewed and reveals the following: Past Medical History:  Diagnosis Date   HLD (hyperlipidemia)    Past Surgical History:  Procedure Laterality Date   ABDOMINAL HYSTERECTOMY     partial   CHOLECYSTECTOMY     COLONOSCOPY WITH PROPOFOL N/A 11/29/2014   Procedure: COLONOSCOPY WITH PROPOFOL;  Surgeon: Garlan Fair, MD;  Location: [facility];  Service: Endoscopy;  Laterality: N/A;   Family Status  Relation Name Status   Mother  (Not Specified)   Father  (Not Specified)   Family History  Problem Relation Age of Onset   Diabetes Mother    Heart disease Mother    Heart failure Mother    Breast cancer Mother 50   Diabetes Father    Heart disease Father    Social History   Socioeconomic History   Marital status: Married    Spouse name: Not on file   Number of children: Not on file   Years of education: Not on file   Highest education level: Not on file  Occupational History   Not on file  Tobacco Use   Smoking status: Never   Smokeless tobacco: Never  Vaping Use   Vaping Use: Never used  Substance and Sexual Activity   Alcohol use: Yes    Comment: rarely   Drug use: No   Sexual activity: Not Currently    Comment: 1st intercourse 72 yo-Fewer than 5 partners  Other Topics Concern   Not on file  Social History Narrative   Not on file   Social Determinants of Health   Financial Resource Strain: Not on file  Food Insecurity: Not on file  Transportation Needs: Not on file  Physical Activity: Not on file  Stress: Not on file  Social Connections: Not on file   Outpatient Medications Prior to Visit   Medication Sig Note   Ascorbic Acid (VITAMIN C) 1000 MG tablet Take 1,000 mg by mouth daily.    Cyanocobalamin (VITAMIN B-12 PO) Take 4 mcg by mouth daily.    Multiple Vitamins-Minerals (MULTIVITAMIN ADULTS PO) Take 2 capsules by mouth daily.    NONFORMULARY OR COMPOUNDED ITEM Take 2 tablets by mouth daily. 11/05/2022: Livotrit-Milk Thistle   NONFORMULARY OR COMPOUNDED ITEM Take 2 capsules by mouth daily. 11/05/2022: HistoX    NONFORMULARY OR COMPOUNDED ITEM Take 3 tablets by mouth daily. 11/05/2022: TurmericForte    NONFORMULARY OR COMPOUNDED ITEM Take 3 capsules by mouth daily. 11/05/2022: Uqora-bladder health   Red Yeast Rice Extract (RED YEAST RICE PO)  Take 400-600 mg by mouth daily. 11/05/2022: 200-300 CoQ10   VITAMIN D, CHOLECALCIFEROL, PO Take 7,000 Int'l Units by mouth daily.    loratadine (CLARITIN) 10 MG tablet Take 10 mg by mouth daily. (Patient not taking: Reported on 11/05/2022) 11/05/2022: prn   omega-3 acid ethyl esters (LOVAZA) 1 g capsule Take 1 g by mouth 2 (two) times daily.    [DISCONTINUED] B Complex-C (B-COMPLEX WITH VITAMIN C) tablet Take 1 tablet by mouth daily.    [DISCONTINUED] ibuprofen (ADVIL,MOTRIN) 200 MG tablet Take 400 mg by mouth every 6 (six) hours as needed for mild pain or moderate pain.    [DISCONTINUED] loratadine (CLARITIN) 10 MG tablet Take 10 mg by mouth daily as needed for allergies.    [DISCONTINUED] terconazole (TERAZOL 7) 0.4 % vaginal cream Place 1 applicator vaginally at bedtime. For 7 nights    [DISCONTINUED] vitamin C (ASCORBIC ACID) 500 MG tablet Take 500 mg by mouth daily.    [DISCONTINUED] VITAMIN D PO Take by mouth.    No facility-administered medications prior to visit.   Allergies  Allergen Reactions   Sulfa Antibiotics Itching   Penicillins Other (See Comments)    Passed out   Tomato Rash    headache    There is no immunization history on file for this patient.  Health Maintenance Due Health Maintenance Topics with due status:  Overdue     Topic Date Due   COVID-19 Vaccine Never done   Hepatitis C Screening Never done   DTaP/Tdap/Td Never done   Zoster Vaccines- Shingrix Never done   Pneumonia Vaccine 63+ Years old Never done   MAMMOGRAM 11/04/2020   Medicare Annual Wellness (AWV) 10/09/2022    Review of Systems All review of systems negative except what is listed in the HPI   Objective    BP 130/80   Pulse 90   Ht '5\' 3"'$  (1.6 m)   Wt 126 lb 12.8 oz (57.5 kg)   BMI 22.46 kg/m  Physical Exam Vitals and nursing note reviewed.  Constitutional:      General: She is not in acute distress.    Appearance: Normal appearance.  Eyes:     Extraocular Movements: Extraocular movements intact.     Conjunctiva/sclera: Conjunctivae normal.     Pupils: Pupils are equal, round, and reactive to light.  Neck:     Vascular: No carotid bruit.  Cardiovascular:     Rate and Rhythm: Normal rate and regular rhythm.     Pulses: Normal pulses.     Heart sounds: Normal heart sounds. No murmur heard. Pulmonary:     Effort: Pulmonary effort is normal.     Breath sounds: Normal breath sounds. No wheezing.  Abdominal:     General: Bowel sounds are normal. There is no distension.     Palpations: Abdomen is soft. There is no mass.     Tenderness: There is abdominal tenderness. There is no right CVA tenderness, left CVA tenderness, guarding or rebound.  Musculoskeletal:        General: Normal range of motion.     Cervical back: Normal range of motion.     Right lower leg: No edema.     Left lower leg: No edema.  Skin:    General: Skin is warm and dry.     Capillary Refill: Capillary refill takes less than 2 seconds.  Neurological:     General: No focal deficit present.     Mental Status: She is alert and oriented to person,  place, and time.  Psychiatric:        Mood and Affect: Mood normal.        Behavior: Behavior normal.        Thought Content: Thought content normal.        Judgment: Judgment normal.      Results for orders placed or performed in visit on 11/05/22  Lyme Disease Serology w/Reflex  Result Value Ref Range   Lyme Total Antibody EIA Negative Negative    Assessment & Plan      Problem List Items Addressed This Visit     HLD (hyperlipidemia)    Chronic. No statin therapy- declined. Will plan to repeat labs in/around May. Diet and exercise recommendations provided.       Relevant Medications   omega-3 acid ethyl esters (LOVAZA) 1 g capsule   Hashimoto's disease    Laboratory evidence of hashimotos without thyroiditis present. She is seeing integrative medicine for management at this time. No medications currently. Considering armor thyroid for treatment. Labs are needed again in May. If she would like me to do basic labs, I will be happy to do so. Insurance will likely not cover labs outside of those with necessary diagnosis or speciality labs- these will need to be completed with integrative medicine if she wishes to have these.       Encounter for medical examination to establish care - Primary    Review of current and past medical history, social history, medication, and family history.  Review of care gaps and health maintenance recommendations.  Records from recent providers to be requested if not available in Chart Review or Care Everywhere.  Recommendations for health maintenance, diet, and exercise provided.  Discussion of acute needs, labs, and medication refills provided based on patient needs and requests.  Follow-up recommended in 21month.       Vaginal lesion    Recurrent vaginal lesions of unknown etiology. Description sounds like HSV infection, especially since the symptoms resolved with use of antiviral medication. She sees GYN tomorrow. Examination deferred today. Will monitor.       Other fatigue    Fatigue of unknown etiology. She would like to have labs done today to ensure that her symptoms are not related to Lyme Disease. We will obtain these  labs for her today.       Relevant Orders   Lyme Disease Serology w/Reflex (Completed)   Other Visit Diagnoses     Abnormal white blood cell (WBC) count       Relevant Orders   Lyme Disease Serology w/Reflex (Completed)        Return if symptoms worsen or fail to improve.    Time: 55 minutes, >50% spent counseling, care coordination, chart review, and documentation.    Glenola Wheat, SCoralee Pesa NP, DNP, AGNP-C [facility]

## 2022-11-06 LAB — LYME DISEASE SEROLOGY W/REFLEX: Lyme Total Antibody EIA: NEGATIVE

## 2022-11-14 DIAGNOSIS — L439 Lichen planus, unspecified: Secondary | ICD-10-CM | POA: Insufficient documentation

## 2022-11-14 DIAGNOSIS — R5383 Other fatigue: Secondary | ICD-10-CM | POA: Insufficient documentation

## 2022-11-14 DIAGNOSIS — N898 Other specified noninflammatory disorders of vagina: Secondary | ICD-10-CM | POA: Insufficient documentation

## 2022-11-14 NOTE — Assessment & Plan Note (Signed)
Laboratory evidence of hashimotos without thyroiditis present. She is seeing integrative medicine for management at this time. No medications currently. Considering armor thyroid for treatment. Labs are needed again in May. If she would like me to do basic labs, I will be happy to do so. Insurance will likely not cover labs outside of those with necessary diagnosis or speciality labs- these will need to be completed with integrative medicine if she wishes to have these.

## 2022-11-14 NOTE — Assessment & Plan Note (Signed)
>>  ASSESSMENT AND PLAN FOR VAGINAL LESION WRITTEN ON 11/14/2022  9:23 PM BY Zackarey Holleman E, NP  Recurrent vaginal lesions of unknown etiology. Description sounds like HSV infection, especially since the symptoms resolved with use of antiviral medication. She sees GYN tomorrow. Examination deferred today. Will monitor.

## 2022-11-14 NOTE — Assessment & Plan Note (Signed)
Chronic. No statin therapy- declined. Will plan to repeat labs in/around May. Diet and exercise recommendations provided.

## 2022-11-14 NOTE — Assessment & Plan Note (Signed)
Recurrent vaginal lesions of unknown etiology. Description sounds like HSV infection, especially since the symptoms resolved with use of antiviral medication. She sees GYN tomorrow. Examination deferred today. Will monitor.

## 2022-11-14 NOTE — Assessment & Plan Note (Signed)
Review of current and past medical history, social history, medication, and family history.  Review of care gaps and health maintenance recommendations.  Records from recent providers to be requested if not available in Chart Review or Care Everywhere.  Recommendations for health maintenance, diet, and exercise provided.  Discussion of acute needs, labs, and medication refills provided based on patient needs and requests.  Follow-up recommended in 48month.

## 2022-11-14 NOTE — Assessment & Plan Note (Signed)
Fatigue of unknown etiology. She would like to have labs done today to ensure that her symptoms are not related to Lyme Disease. We will obtain these labs for her today.

## 2023-02-06 ENCOUNTER — Telehealth: Payer: Self-pay | Admitting: Nurse Practitioner

## 2023-02-06 NOTE — Telephone Encounter (Signed)
Contacted Alexandra Mcgrath to schedule their annual wellness visit. Appointment made for 02/11/23.  [name] AWV direct phone # [phone]

## 2023-02-11 ENCOUNTER — Ambulatory Visit (INDEPENDENT_AMBULATORY_CARE_PROVIDER_SITE_OTHER): Payer: Medicare HMO

## 2023-02-11 VITALS — BP 110/70 | HR 97 | Temp 98.2°F | Ht 63.5 in | Wt 129.0 lb

## 2023-02-11 DIAGNOSIS — Z0000 Encounter for general adult medical examination without abnormal findings: Secondary | ICD-10-CM | POA: Diagnosis not present

## 2023-02-11 NOTE — Progress Notes (Signed)
Subjective:   Alexandra Mcgrath is a 72 y.o. female who presents for Medicare Annual (Subsequent) preventive examination.  Review of Systems     Cardiac Risk Factors include: advanced age (>43men, >46 women);dyslipidemia     Objective:    Today's Vitals   02/11/23 1528  BP: 110/70  Pulse: 97  Temp: 98.2 F (36.8 C)  TempSrc: Oral  SpO2: 94%  Weight: 129 lb (58.5 kg)  Height: 5' 3.5" (1.613 m)   Body mass index is 22.49 kg/m.     02/11/2023    3:36 PM 11/29/2014    8:09 AM 11/22/2014    2:38 PM  Advanced Directives  Does Patient Have a Medical Advance Directive? Yes Yes Yes  Type of Estate agent of [place];Living will Healthcare Power of [place];Living will Healthcare Power of [place];Living will  Does patient want to make changes to medical advance directive?   No - Patient declined  Copy of Healthcare Power of Attorney in Chart? No - copy requested  No - copy requested    Current Medications (verified) Outpatient Encounter Medications as of 02/11/2023  Medication Sig   Ascorbic Acid (VITAMIN C) 1000 MG tablet Take 1,000 mg by mouth daily.   Cyanocobalamin (VITAMIN B-12 PO) Take 4 mcg by mouth daily.   estradiol (ESTRACE) 0.1 MG/GM vaginal cream Insert 1 applicator(s)ful twice a week by vaginal route.   loratadine (CLARITIN) 10 MG tablet Take 10 mg by mouth daily.   Multiple Vitamins-Minerals (MULTIVITAMIN ADULTS PO) Take 2 capsules by mouth daily.   NONFORMULARY OR COMPOUNDED ITEM Take 2 capsules by mouth daily.   NONFORMULARY OR COMPOUNDED ITEM Take 3 tablets by mouth daily.   NONFORMULARY OR COMPOUNDED ITEM Take 3 capsules by mouth daily.   omega-3 acid ethyl esters (LOVAZA) 1 g capsule Take 1 g by mouth 2 (two) times daily.   Red Yeast Rice Extract (RED YEAST RICE PO) Take 400-600 mg by mouth daily.   VITAMIN D, CHOLECALCIFEROL, PO Take 7,000 Int'l Units by mouth daily.   NONFORMULARY OR COMPOUNDED ITEM Take 2 tablets by mouth daily.  (Patient not taking: Reported on 02/11/2023)   No facility-administered encounter medications on file as of 02/11/2023.    Allergies (verified) Sulfa antibiotics, Penicillins, and Tomato   History: Past Medical History:  Diagnosis Date   HLD (hyperlipidemia)    Past Surgical History:  Procedure Laterality Date   ABDOMINAL HYSTERECTOMY     partial   CHOLECYSTECTOMY     COLONOSCOPY WITH PROPOFOL N/A 11/29/2014   Procedure: COLONOSCOPY WITH PROPOFOL;  Surgeon: Charolett Bumpers, MD;  Location: [facility];  Service: Endoscopy;  Laterality: N/A;   Family History  Problem Relation Age of Onset   Diabetes Mother    Heart disease Mother    Heart failure Mother    Breast cancer Mother 17   Diabetes Father    Heart disease Father    Social History   Socioeconomic History   Marital status: Married    Spouse name: Not on file   Number of children: Not on file   Years of education: Not on file   Highest education level: Not on file  Occupational History   Not on file  Tobacco Use   Smoking status: Never   Smokeless tobacco: Never  Vaping Use   Vaping Use: Never used  Substance and Sexual Activity   Alcohol use: Yes    Comment: rarely   Drug use: No   Sexual activity: Not Currently  Comment: 1st intercourse 72 yo-Fewer than 5 partners  Other Topics Concern   Not on file  Social History Narrative   Not on file   Social Determinants of Health   Financial Resource Strain: Low Risk  (02/11/2023)   Overall Financial Resource Strain (CARDIA)    Difficulty of Paying Living Expenses: Not hard at all  Food Insecurity: No Food Insecurity (02/11/2023)   Hunger Vital Sign    Worried About Running Out of Food in the Last Year: Never true    Ran Out of Food in the Last Year: Never true  Transportation Needs: No Transportation Needs (02/11/2023)   PRAPARE - Administrator, Civil Service (Medical): No    Lack of Transportation (Non-Medical): No  Physical Activity:  Sufficiently Active (02/11/2023)   Exercise Vital Sign    Days of Exercise per Week: 3 days    Minutes of Exercise per Session: 60 min  Stress: No Stress Concern Present (02/11/2023)   [employer] of Occupational Health - Occupational Stress Questionnaire    Feeling of Stress : Only a little  Social Connections: Not on file    Tobacco Counseling Counseling given: Not Answered   Clinical Intake:  Pre-visit preparation completed: Yes  Pain : No/denies pain     Nutritional Status: BMI of 19-24  Normal Nutritional Risks: None Diabetes: No  How often do you need to have someone help you when you read instructions, pamphlets, or other written materials from your doctor or pharmacy?: 1 - Never  Diabetic? no  Interpreter Needed?: No  Information entered by :: NAllen LPN   Activities of Daily Living    02/11/2023    3:38 PM  In your present state of health, do you have any difficulty performing the following activities:  Hearing? 1  Vision? 0  Difficulty concentrating or making decisions? 0  Walking or climbing stairs? 0  Dressing or bathing? 0  Doing errands, shopping? 0  Preparing Food and eating ? N  Using the Toilet? N  In the past six months, have you accidently leaked urine? Y  Comment has incontinence  Do you have problems with loss of bowel control? N  Managing your Medications? N  Managing your Finances? N  Housekeeping or managing your Housekeeping? N    Patient Care Team: Early, Sung Amabile, NP as PCP - General (Nurse Practitioner)  Indicate any recent Medical Services you may have received from other than [facility] providers in the past year (date may be approximate).     Assessment:   This is a routine wellness examination for Bridey.  Hearing/Vision screen Vision Screening - Comments:: Regular eye exams , [facility]  Dietary issues and exercise activities discussed: Current Exercise Habits: Structured exercise class, Type of exercise: yoga, Time  (Minutes): 60, Frequency (Times/Week): 3, Weekly Exercise (Minutes/Week): 180   Goals Addressed             This Visit's Progress    Patient Stated       02/11/2023, wants to be a better version of herself for better quality of life       Depression Screen    02/11/2023    3:37 PM 11/05/2022   11:23 AM  PHQ 2/9 Scores  PHQ - 2 Score 0 0  PHQ- 9 Score 0     Fall Risk    02/11/2023    3:37 PM 11/05/2022   11:23 AM  Fall Risk   Falls in the past year? 0  0  Number falls in past yr: 0 0  Injury with Fall? 0 0  Risk for fall due to : No Fall Risks No Fall Risks  Follow up Falls prevention discussed;Education provided;Falls evaluation completed Falls evaluation completed    FALL RISK PREVENTION PERTAINING TO THE HOME:  Any stairs in or around the home? Yes  If so, are there any without handrails? No  Home free of loose throw rugs in walkways, pet beds, electrical cords, etc? Yes  Adequate lighting in your home to reduce risk of falls? Yes   ASSISTIVE DEVICES UTILIZED TO PREVENT FALLS:  Life alert? No  Use of a cane, walker or w/c? No  Grab bars in the bathroom? No  Shower chair or bench in shower? No  Elevated toilet seat or a handicapped toilet? Yes   TIMED UP AND GO:  Was the test performed? Yes .  Length of time to ambulate 10 feet: 5 sec.   Gait steady and fast without use of assistive device  Cognitive Function:        02/11/2023    3:43 PM  6CIT Screen  What Year? 0 points  What month? 0 points  What time? 0 points  Count back from 20 0 points  Months in reverse 0 points  Repeat phrase 2 points  Total Score 2 points    Immunizations  There is no immunization history on file for this patient.  TDAP status: Due, Education has been provided regarding the importance of this vaccine. Advised may receive this vaccine at local pharmacy or Health Dept. Aware to provide a copy of the vaccination record if obtained from local pharmacy or Health Dept.  Verbalized acceptance and understanding.  Flu Vaccine status: Declined, Education has been provided regarding the importance of this vaccine but patient still declined. Advised may receive this vaccine at local pharmacy or Health Dept. Aware to provide a copy of the vaccination record if obtained from local pharmacy or Health Dept. Verbalized acceptance and understanding.  Pneumococcal vaccine status: Declined,  Education has been provided regarding the importance of this vaccine but patient still declined. Advised may receive this vaccine at local pharmacy or Health Dept. Aware to provide a copy of the vaccination record if obtained from local pharmacy or Health Dept. Verbalized acceptance and understanding.   Covid-19 vaccine status: Declined, Education has been provided regarding the importance of this vaccine but patient still declined. Advised may receive this vaccine at local pharmacy or Health Dept.or vaccine clinic. Aware to provide a copy of the vaccination record if obtained from local pharmacy or Health Dept. Verbalized acceptance and understanding.  Qualifies for Shingles Vaccine? Yes   Zostavax completed No   Shingrix Completed?: No.    Education has been provided regarding the importance of this vaccine. Patient has been advised to call insurance company to determine out of pocket expense if they have not yet received this vaccine. Advised may also receive vaccine at local pharmacy or Health Dept. Verbalized acceptance and understanding.  Screening Tests Health Maintenance  Topic Date Due   Hepatitis C Screening  Never done   DTaP/Tdap/Td (1 - Tdap) Never done   MAMMOGRAM  11/04/2020   COVID-19 Vaccine (1) 02/27/2023 (Originally 05/24/1956)   Zoster Vaccines- Shingrix (1 of 2) 05/14/2023 (Originally 05/24/1970)   Pneumonia Vaccine 67+ Years old (1 of 1 - PCV) 02/11/2024 (Originally 05/24/2016)   INFLUENZA VACCINE  05/08/2023   Medicare Annual Wellness (AWV)  02/11/2024   COLONOSCOPY  (Pts 45-27yrs  Insurance coverage will need to be confirmed)  10/24/2032   DEXA SCAN  Completed   HPV VACCINES  Aged Out    Health Maintenance  Health Maintenance Due  Topic Date Due   Hepatitis C Screening  Never done   DTaP/Tdap/Td (1 - Tdap) Never done   MAMMOGRAM  11/04/2020    Colorectal cancer screening: Type of screening: Colonoscopy. Completed 10/24/2022. Repeat every 5 years  Mammogram status: Completed 11/2022. Repeat every year  Bone Density status: Completed 01/29/2023.   Lung Cancer Screening: (Low Dose CT Chest recommended if Age 59-80 years, 30 pack-year currently smoking OR have quit w/in 15years.) does not qualify.   Lung Cancer Screening Referral: no  Additional Screening:  Hepatitis C Screening: does qualify;  Vision Screening: Recommended annual ophthalmology exams for early detection of glaucoma and other disorders of the eye. Is the patient up to date with their annual eye exam?  Yes  Who is the provider or what is the name of the office in which the patient attends annual eye exams? [facility] If pt is not established with a provider, would they like to be referred to a provider to establish care? No .   Dental Screening: Recommended annual dental exams for proper oral hygiene  Community Resource Referral / Chronic Care Management: CRR required this visit?  No   CCM required this visit?  No      Plan:     I have personally reviewed and noted the following in the patient's chart:   Medical and social history Use of alcohol, tobacco or illicit drugs  Current medications and supplements including opioid prescriptions. Patient is not currently taking opioid prescriptions. Functional ability and status Nutritional status Physical activity Advanced directives List of other physicians Hospitalizations, surgeries, and ER visits in previous 12 months Vitals Screenings to include cognitive, depression, and falls Referrals and appointments  In  addition, I have reviewed and discussed with patient certain preventive protocols, quality metrics, and best practice recommendations. A written personalized care plan for preventive services as well as general preventive health recommendations were provided to patient.     Barb Merino, LPN   11/07/3084   Nurse Notes: none

## 2023-02-11 NOTE — Patient Instructions (Signed)
Alexandra Mcgrath , Thank you for taking time to come for your Medicare Wellness Visit. I appreciate your ongoing commitment to your health goals. Please review the following plan we discussed and let me know if I can assist you in the future.   These are the goals we discussed:  Goals      Patient Stated     02/11/2023, wants to be a better version of herself for better quality of life        This is a list of the screening recommended for you and due dates:  Health Maintenance  Topic Date Due   Hepatitis C Screening: USPSTF Recommendation to screen - Ages 28-79 yo.  Never done   DTaP/Tdap/Td vaccine (1 - Tdap) Never done   Mammogram  11/04/2020   COVID-19 Vaccine (1) 02/27/2023*   Zoster (Shingles) Vaccine (1 of 2) 05/14/2023*   Pneumonia Vaccine (1 of 1 - PCV) 02/11/2024*   Flu Shot  05/08/2023   Medicare Annual Wellness Visit  02/11/2024   Colon Cancer Screening  10/24/2032   DEXA scan (bone density measurement)  Completed   HPV Vaccine  Aged Out  *Topic was postponed. The date shown is not the original due date.    Advanced directives: Please bring a copy of your POA (Power of Attorney) and/or Living Will to your next appointment.   Conditions/risks identified: none  Next appointment: Follow up in one year for your annual wellness visit    Preventive Care 65 Years and Older, Female Preventive care refers to lifestyle choices and visits with your health care provider that can promote health and wellness. What does preventive care include? A yearly physical exam. This is also called an annual well check. Dental exams once or twice a year. Routine eye exams. Ask your health care provider how often you should have your eyes checked. Personal lifestyle choices, including: Daily care of your teeth and gums. Regular physical activity. Eating a healthy diet. Avoiding tobacco and drug use. Limiting alcohol use. Practicing safe sex. Taking low-dose aspirin every day. Taking vitamin  and mineral supplements as recommended by your health care provider. What happens during an annual well check? The services and screenings done by your health care provider during your annual well check will depend on your age, overall health, lifestyle risk factors, and family history of disease. Counseling  Your health care provider may ask you questions about your: Alcohol use. Tobacco use. Drug use. Emotional well-being. Home and relationship well-being. Sexual activity. Eating habits. History of falls. Memory and ability to understand (cognition). Work and work Astronomer. Reproductive health. Screening  You may have the following tests or measurements: Height, weight, and BMI. Blood pressure. Lipid and cholesterol levels. These may be checked every 5 years, or more frequently if you are over 25 years old. Skin check. Lung cancer screening. You may have this screening every year starting at age 63 if you have a 30-pack-year history of smoking and currently smoke or have quit within the past 15 years. Fecal occult blood test (FOBT) of the stool. You may have this test every year starting at age 32. Flexible sigmoidoscopy or colonoscopy. You may have a sigmoidoscopy every 5 years or a colonoscopy every 10 years starting at age 34. Hepatitis C blood test. Hepatitis B blood test. Sexually transmitted disease (STD) testing. Diabetes screening. This is done by checking your blood sugar (glucose) after you have not eaten for a while (fasting). You may have this done every 1-3 years.  Bone density scan. This is done to screen for osteoporosis. You may have this done starting at age 76. Mammogram. This may be done every 1-2 years. Talk to your health care provider about how often you should have regular mammograms. Talk with your health care provider about your test results, treatment options, and if necessary, the need for more tests. Vaccines  Your health care provider may recommend  certain vaccines, such as: Influenza vaccine. This is recommended every year. Tetanus, diphtheria, and acellular pertussis (Tdap, Td) vaccine. You may need a Td booster every 10 years. Zoster vaccine. You may need this after age 92. Pneumococcal 13-valent conjugate (PCV13) vaccine. One dose is recommended after age 36. Pneumococcal polysaccharide (PPSV23) vaccine. One dose is recommended after age 23. Talk to your health care provider about which screenings and vaccines you need and how often you need them. This information is not intended to replace advice given to you by your health care provider. Make sure you discuss any questions you have with your health care provider. Document Released: 10/20/2015 Document Revised: 06/12/2016 Document Reviewed: 07/25/2015 Elsevier Interactive Patient Education  2017 [employer] Prevention in the Home Falls can cause injuries. They can happen to people of all ages. There are many things you can do to make your home safe and to help prevent falls. What can I do on the outside of my home? Regularly fix the edges of walkways and driveways and fix any cracks. Remove anything that might make you trip as you walk through a door, such as a raised step or threshold. Trim any bushes or trees on the path to your home. Use bright outdoor lighting. Clear any walking paths of anything that might make someone trip, such as rocks or tools. Regularly check to see if handrails are loose or broken. Make sure that both sides of any steps have handrails. Any raised decks and porches should have guardrails on the edges. Have any leaves, snow, or ice cleared regularly. Use sand or salt on walking paths during winter. Clean up any spills in your garage right away. This includes oil or grease spills. What can I do in the bathroom? Use night lights. Install grab bars by the toilet and in the tub and shower. Do not use towel bars as grab bars. Use non-skid mats or  decals in the tub or shower. If you need to sit down in the shower, use a plastic, non-slip stool. Keep the floor dry. Clean up any water that spills on the floor as soon as it happens. Remove soap buildup in the tub or shower regularly. Attach bath mats securely with double-sided non-slip rug tape. Do not have throw rugs and other things on the floor that can make you trip. What can I do in the bedroom? Use night lights. Make sure that you have a light by your bed that is easy to reach. Do not use any sheets or blankets that are too big for your bed. They should not hang down onto the floor. Have a firm chair that has side arms. You can use this for support while you get dressed. Do not have throw rugs and other things on the floor that can make you trip. What can I do in the kitchen? Clean up any spills right away. Avoid walking on wet floors. Keep items that you use a lot in easy-to-reach places. If you need to reach something above you, use a strong step stool that has a grab bar.  Keep electrical cords out of the way. Do not use floor polish or wax that makes floors slippery. If you must use wax, use non-skid floor wax. Do not have throw rugs and other things on the floor that can make you trip. What can I do with my stairs? Do not leave any items on the stairs. Make sure that there are handrails on both sides of the stairs and use them. Fix handrails that are broken or loose. Make sure that handrails are as long as the stairways. Check any carpeting to make sure that it is firmly attached to the stairs. Fix any carpet that is loose or worn. Avoid having throw rugs at the top or bottom of the stairs. If you do have throw rugs, attach them to the floor with carpet tape. Make sure that you have a light switch at the top of the stairs and the bottom of the stairs. If you do not have them, ask someone to add them for you. What else can I do to help prevent falls? Wear shoes that: Do not  have high heels. Have rubber bottoms. Are comfortable and fit you well. Are closed at the toe. Do not wear sandals. If you use a stepladder: Make sure that it is fully opened. Do not climb a closed stepladder. Make sure that both sides of the stepladder are locked into place. Ask someone to hold it for you, if possible. Clearly mark and make sure that you can see: Any grab bars or handrails. First and last steps. Where the edge of each step is. Use tools that help you move around (mobility aids) if they are needed. These include: Canes. Walkers. Scooters. Crutches. Turn on the lights when you go into a dark area. Replace any light bulbs as soon as they burn out. Set up your furniture so you have a clear path. Avoid moving your furniture around. If any of your floors are uneven, fix them. If there are any pets around you, be aware of where they are. Review your medicines with your doctor. Some medicines can make you feel dizzy. This can increase your chance of falling. Ask your doctor what other things that you can do to help prevent falls. This information is not intended to replace advice given to you by your health care provider. Make sure you discuss any questions you have with your health care provider. Document Released: 07/20/2009 Document Revised: 02/29/2016 Document Reviewed: 10/28/2014 Elsevier Interactive Patient Education  2017 [employer].

## 2023-05-16 ENCOUNTER — Encounter: Payer: Self-pay | Admitting: Nurse Practitioner

## 2023-05-16 ENCOUNTER — Ambulatory Visit: Payer: Medicare HMO | Admitting: Nurse Practitioner

## 2023-05-16 VITALS — BP 132/82 | HR 77 | Wt 128.6 lb

## 2023-05-16 DIAGNOSIS — D729 Disorder of white blood cells, unspecified: Secondary | ICD-10-CM | POA: Insufficient documentation

## 2023-05-16 DIAGNOSIS — L439 Lichen planus, unspecified: Secondary | ICD-10-CM

## 2023-05-16 DIAGNOSIS — E063 Autoimmune thyroiditis: Secondary | ICD-10-CM

## 2023-05-16 DIAGNOSIS — M25472 Effusion, left ankle: Secondary | ICD-10-CM

## 2023-05-16 DIAGNOSIS — R5383 Other fatigue: Secondary | ICD-10-CM | POA: Diagnosis not present

## 2023-05-16 DIAGNOSIS — R0683 Snoring: Secondary | ICD-10-CM

## 2023-05-16 DIAGNOSIS — M25471 Effusion, right ankle: Secondary | ICD-10-CM

## 2023-05-16 DIAGNOSIS — R002 Palpitations: Secondary | ICD-10-CM

## 2023-05-16 LAB — IRON,TIBC AND FERRITIN PANEL

## 2023-05-16 LAB — COMPREHENSIVE METABOLIC PANEL

## 2023-05-16 LAB — CBC WITH DIFFERENTIAL/PLATELET
Basophils Absolute: 0.1 10*3/uL (ref 0.0–0.2)
Basos: 1 %
EOS (ABSOLUTE): 0.1 10*3/uL (ref 0.0–0.4)
Eos: 1 %
Hematocrit: 40.9 % (ref 34.0–46.6)
Hemoglobin: 13.8 g/dL (ref 11.1–15.9)
Immature Grans (Abs): 0 10*3/uL (ref 0.0–0.1)
Immature Granulocytes: 0 %
Lymphocytes Absolute: 1.9 10*3/uL (ref 0.7–3.1)
Lymphs: 35 %
MCH: 32.3 pg (ref 26.6–33.0)
MCHC: 33.7 g/dL (ref 31.5–35.7)
MCV: 96 fL (ref 79–97)
Monocytes Absolute: 0.5 10*3/uL (ref 0.1–0.9)
Monocytes: 10 %
Neutrophils Absolute: 2.8 10*3/uL (ref 1.4–7.0)
Neutrophils: 53 %
Platelets: 250 10*3/uL (ref 150–450)
RBC: 4.27 x10E6/uL (ref 3.77–5.28)
RDW: 11.8 % (ref 11.7–15.4)
WBC: 5.3 10*3/uL (ref 3.4–10.8)

## 2023-05-16 LAB — VITAMIN B12

## 2023-05-16 LAB — TSH

## 2023-05-16 LAB — BRAIN NATRIURETIC PEPTIDE

## 2023-05-16 LAB — T4, FREE

## 2023-05-16 LAB — VITAMIN D 25 HYDROXY (VIT D DEFICIENCY, FRACTURES)

## 2023-05-16 LAB — C-REACTIVE PROTEIN

## 2023-05-16 NOTE — Progress Notes (Signed)
Tollie Eth, DNP, AGNP-c [facility] [address] [place], [place] [address] [phone]   ACUTE VISIT- ESTABLISHED PATIENT  Blood pressure 132/82, pulse 77, weight 128 lb 9.6 oz (58.3 kg).  Subjective:  HPI Alexandra Mcgrath is a 72 y.o. female presents to day for evaluation of:   Lichen Planus [place] tells me she received a diagnosis for the vaginal rash present. She had a biopsy completed by dermatology in July and was diagnosed with Lichen planus. She is using triamcinolone three times a week and reports the rash is improving.   Fatigue She tells me that she has felt very fatigued over the last several months for no known reasons.  She tells me she saw her functional medicine doctor and he recommended B12 shots and IV fluids after performing muscle testing. She tells me she went 04/26/2023 to the [facility] and had b12 in IV fluid and B12 additive given, but she did not have any significant improvement for any length of time. It was recommended by functional medicine to continue with these treatments several times a month, but she reports the treatments are expensive and cost prohibitive.  She is unsure of the cause of her ongoing fatigue and is concerned that there could be inflammation or autoimmune causes. 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   Colon Cancer Screening She tells me it took 3 months for her to return to "normal" after a colonoscopy in January. She tells me it felt like she had surgery. She did have 8 polyps removed.    PMH, Medications, and Allergies reviewed and updated in chart as appropriate.   ROS negative except for what is listed in HPI. Objective:  Physical Exam Vitals and nursing note reviewed.  Constitutional:      General: She is not in acute distress.    Appearance: Normal appearance.  HENT:     Head: Normocephalic.  Eyes:     Conjunctiva/sclera: Conjunctivae normal.  Neck:      Vascular: No carotid bruit.  Cardiovascular:     Rate and Rhythm: Normal rate and regular rhythm.     Pulses: Normal pulses.     Heart sounds: Normal heart sounds.  Pulmonary:     Effort: Pulmonary effort is normal.     Breath sounds: Normal breath sounds.  Musculoskeletal:     Right lower leg: No edema.     Left lower leg: No edema.     Comments: Slight ankle swelling bilateral  Lymphadenopathy:     Cervical: No cervical adenopathy.  Skin:    General: Skin is warm and dry.     Capillary Refill: Capillary refill takes less than 2 seconds.     Coloration: Skin is not pale.     Findings: No bruising.  Neurological:     General: No focal deficit present.     Mental Status: She is alert and oriented to person, place, and time.     Motor: No weakness.     Gait: Gait normal.  Psychiatric:        Behavior: Behavior normal.         Assessment & Plan:   Problem List Items Addressed This Visit     Hashimoto's disease    Chronic. Management with functional/integrative medicine at this time. She is not currently on medications. Will recheck labs today to determine if thyroid abnormalities could be the cause of increased fatigue. No changes today.  Relevant Orders   Vitamin B12 (Completed)   CBC with Differential/Platelet (Completed)   Comprehensive metabolic panel (Completed)   Iron, TIBC and Ferritin Panel (Completed)   T4, free (Completed)   TSH (Completed)   VITAMIN D 25 Hydroxy (Vit-D Deficiency, Fractures) (Completed)   C-reactive protein (Completed)   Brain natriuretic peptide (Completed)   ANA, IFA (with reflex) (Completed)   Fatigue - Primary    Ongoing fatigue of unknown cause. She has had IV fluids and unknown amounts of IV B12 with no improvement. She has concerns that additional autoimmune components could be causing the fatigue that continues. We discussed that this is possible given the presence of lichen planus and hashimotos, but there are times when  laboratory analysis is not definitive. We will plan to obtain baseline labs today for further evaluation and make changes and testing as necessary based on findings.       Relevant Orders   Vitamin B12 (Completed)   CBC with Differential/Platelet (Completed)   Comprehensive metabolic panel (Completed)   Iron, TIBC and Ferritin Panel (Completed)   T4, free (Completed)   TSH (Completed)   VITAMIN D 25 Hydroxy (Vit-D Deficiency, Fractures) (Completed)   C-reactive protein (Completed)   Brain natriuretic peptide (Completed)   ANA, IFA (with reflex) (Completed)   Lichen planus    Recent diagnosis with biopsy from dermatology. Triamcinolone is working to help control this at this time. Continue current therapy and monitoring.       Relevant Orders   ANA, IFA (with reflex) (Completed)   Snores    Patient reports snoring at night with significant daytime fatigue. This does cause concern for possible sleep apnea presence. We will plan to consider sleep study if we are unable to determine the causes through labs.       Relevant Orders   Vitamin B12 (Completed)   CBC with Differential/Platelet (Completed)   Comprehensive metabolic panel (Completed)   Iron, TIBC and Ferritin Panel (Completed)   T4, free (Completed)   TSH (Completed)   VITAMIN D 25 Hydroxy (Vit-D Deficiency, Fractures) (Completed)   C-reactive protein (Completed)   Intermittent palpitations    Chronic. Will monitor labs. No alarm symptoms or symptoms present today      Relevant Orders   Vitamin B12 (Completed)   CBC with Differential/Platelet (Completed)   Comprehensive metabolic panel (Completed)   Iron, TIBC and Ferritin Panel (Completed)   T4, free (Completed)   TSH (Completed)   VITAMIN D 25 Hydroxy (Vit-D Deficiency, Fractures) (Completed)   C-reactive protein (Completed)   Abnormal white blood cell (WBC) count   Relevant Orders   Vitamin B12 (Completed)   CBC with Differential/Platelet (Completed)    Comprehensive metabolic panel (Completed)   Iron, TIBC and Ferritin Panel (Completed)   T4, free (Completed)   TSH (Completed)   VITAMIN D 25 Hydroxy (Vit-D Deficiency, Fractures) (Completed)   C-reactive protein (Completed)   Other Visit Diagnoses     Swelling of both ankles       Relevant Orders   Vitamin B12 (Completed)   CBC with Differential/Platelet (Completed)   Comprehensive metabolic panel (Completed)   Iron, TIBC and Ferritin Panel (Completed)   T4, free (Completed)   TSH (Completed)   VITAMIN D 25 Hydroxy (Vit-D Deficiency, Fractures) (Completed)   C-reactive protein (Completed)   Brain natriuretic peptide (Completed)         Time: 49 minutes, >50% spent counseling, care coordination, chart review, and documentation.    Tollie Eth,  DNP, AGNP-c   History, Medications, Surgery, SDOH, and Family History reviewed and updated as appropriate.

## 2023-05-16 NOTE — Patient Instructions (Signed)
Fatigue If you have fatigue, you feel tired all the time and have a lack of energy or a lack of motivation. Fatigue may make it difficult to start or complete tasks because of exhaustion. Occasional or mild fatigue is often a normal response to activity or life. However, long-term (chronic) or extreme fatigue may be a symptom of a medical condition such as: Depression. Not having enough red blood cells or hemoglobin in the blood (anemia). A problem with a small gland located in the lower front part of the neck (thyroid disorder). Rheumatologic conditions. These are problems related to the body's defense system (immune system). Infections, especially certain viral infections. Fatigue can also lead to negative health outcomes over time. Follow these instructions at home: Medicines Take over-the-counter and prescription medicines only as told by your health care provider. Take a multivitamin if told by your health care provider. Do not use herbal or dietary supplements unless they are approved by your health care provider. Eating and drinking  Avoid heavy meals in the evening. Eat a well-balanced diet, which includes lean proteins, whole grains, plenty of fruits and vegetables, and low-fat dairy products. Avoid eating or drinking too many products with caffeine in them. Avoid alcohol. Drink enough fluid to keep your urine pale yellow. Activity  Exercise regularly, as told by your health care provider. Use or practice techniques to help you relax, such as yoga, tai chi, meditation, or massage therapy. Lifestyle Change situations that cause you stress. Try to keep your work and personal schedules in balance. Do not use recreational or illegal drugs. General instructions Monitor your fatigue for any changes. Go to bed and get up at the same time every day. Avoid fatigue by pacing yourself during the day and getting enough sleep at night. Maintain a healthy weight. Contact a health care  provider if: Your fatigue does not get better. You have a fever. You suddenly lose or gain weight. You have headaches. You have trouble falling asleep or sleeping through the night. You feel angry, guilty, anxious, or sad. You have swelling in your legs or another part of your body. Get help right away if: You feel confused, feel like you might faint, or faint. Your vision is blurry or you have a severe headache. You have severe pain in your abdomen, your back, or the area between your waist and hips (pelvis). You have chest pain, shortness of breath, or an irregular or fast heartbeat. You are unable to urinate, or you urinate less than normal. You have abnormal bleeding from the rectum, nose, lungs, nipples, or, if you are female, the vagina. You vomit blood. You have thoughts about hurting yourself or others. These symptoms may be an emergency. Get help right away. Call 911. Do not wait to see if the symptoms will go away. Do not drive yourself to the hospital. Get help right away if you feel like you may hurt yourself or others, or have thoughts about taking your own life. Go to your nearest emergency room or: Call 911. Call the National Suicide Prevention Lifeline at 1-800-273-8255 or 988. This is open 24 hours a day. Text the Crisis Text Line at 741741. Summary If you have fatigue, you feel tired all the time and have a lack of energy or a lack of motivation. Fatigue may make it difficult to start or complete tasks because of exhaustion. Long-term (chronic) or extreme fatigue may be a symptom of a medical condition. Exercise regularly, as told by your health care provider.   Change situations that cause you stress. Try to keep your work and personal schedules in balance. This information is not intended to replace advice given to you by your health care provider. Make sure you discuss any questions you have with your health care provider. Document Revised: 07/16/2021 Document  Reviewed: 07/16/2021 Elsevier Patient Education  2024 [employer] Inc.  

## 2023-05-28 ENCOUNTER — Ambulatory Visit (INDEPENDENT_AMBULATORY_CARE_PROVIDER_SITE_OTHER): Payer: Medicare HMO | Admitting: Nurse Practitioner

## 2023-05-28 ENCOUNTER — Encounter: Payer: Self-pay | Admitting: Nurse Practitioner

## 2023-05-28 VITALS — BP 122/80 | HR 70 | Temp 98.5°F | Wt 130.4 lb

## 2023-05-28 DIAGNOSIS — R768 Other specified abnormal immunological findings in serum: Secondary | ICD-10-CM

## 2023-05-28 DIAGNOSIS — J029 Acute pharyngitis, unspecified: Secondary | ICD-10-CM | POA: Diagnosis not present

## 2023-05-28 DIAGNOSIS — H66002 Acute suppurative otitis media without spontaneous rupture of ear drum, left ear: Secondary | ICD-10-CM

## 2023-05-28 LAB — POCT INFLUENZA A/B
Influenza A, POC: NEGATIVE
Influenza B, POC: NEGATIVE

## 2023-05-28 LAB — POC COVID19 BINAXNOW: SARS Coronavirus 2 Ag: NEGATIVE

## 2023-05-28 LAB — POCT RAPID STREP A (OFFICE): Rapid Strep A Screen: NEGATIVE

## 2023-05-28 MED ORDER — AZITHROMYCIN 250 MG PO TABS
ORAL_TABLET | ORAL | 0 refills | Status: AC
Start: 2023-05-28 — End: 2023-06-02

## 2023-05-28 NOTE — Patient Instructions (Addendum)
I have sent in Azithromycin to help with the ear infection. I want you to rest and allow yourself to relax for the next week so that your body can focus on healing the ear and the throat.   You can use warm salt water gargles to help with the pain.    Otitis Media, Adult  Otitis media occurs when there is inflammation and fluid in the middle ear with signs and symptoms of an acute infection. The middle ear is a part of the ear that contains bones for hearing as well as air that helps send sounds to the brain. When infected fluid builds up in this space, it causes pressure and can lead to an ear infection. The eustachian tube connects the middle ear to the back of the nose (nasopharynx) and normally allows air into the middle ear. If the eustachian tube becomes blocked, fluid can build up and become infected. What are the causes? This condition is caused by a blockage in the eustachian tube. This can be caused by mucus or by swelling of the tube. Problems that can cause a blockage include: A cold or other upper respiratory infection. Allergies. An irritant, such as tobacco smoke. Enlarged adenoids. The adenoids are areas of soft tissue located high in the back of the throat, behind the nose and the roof of the mouth. They are part of the body's defense system (immune system). A mass in the nasopharynx. Damage to the ear caused by pressure changes (barotrauma). What increases the risk? You are more likely to develop this condition if you: Smoke or are exposed to tobacco smoke. Have an opening in the roof of your mouth (cleft palate). Have gastroesophageal reflux. Have an immune system disorder. What are the signs or symptoms? Symptoms of this condition include: Ear pain. Fever. Decreased hearing. Tiredness (lethargy). Fluid leaking from the ear, if the eardrum is ruptured or has burst. Ringing in the ear. How is this diagnosed?  This condition is diagnosed with a physical exam. During  the exam, your health care provider will use an instrument called an otoscope to look in your ear and check for redness, swelling, and fluid. He or she will also ask about your symptoms. Your health care provider may also order tests, such as: A pneumatic otoscopy. This is a test to check the movement of the eardrum. It is done by squeezing a small amount of air into the ear. A tympanogram. This is a test that shows how well the eardrum moves in response to air pressure in the ear canal. It provides a graph for your health care provider to review. How is this treated? This condition can go away on its own within 3-5 days. But if the condition is caused by a bacterial infection and does not go away on its own, or if it keeps coming back, your health care provider may: Prescribe antibiotic medicine to treat the infection. Prescribe or recommend medicines to control pain. Follow these instructions at home: Take over-the-counter and prescription medicines only as told by your health care provider. If you were prescribed an antibiotic medicine, take it as told by your health care provider. Do not stop taking the antibiotic even if you start to feel better. Keep all follow-up visits. This is important. Contact a health care provider if: You have bleeding from your nose. There is a lump on your neck. You are not feeling better in 5 days. You feel worse instead of better. Get help right away if:  You have severe pain that is not controlled with medicine. You have swelling, redness, or pain around your ear. You have stiffness in your neck. A part of your face is not moving (paralyzed). The bone behind your ear (mastoid bone) is tender when you touch it. You develop a severe headache. Summary Otitis media is redness, soreness, and swelling of the middle ear, usually resulting in pain and decreased hearing. This condition can go away on its own within 3-5 days. If the problem does not go away in 3-5  days, your health care provider may give you medicines to treat the infection. If you were prescribed an antibiotic medicine, take it as told by your health care provider. Follow all instructions that were given to you by your health care provider. This information is not intended to replace advice given to you by your health care provider. Make sure you discuss any questions you have with your health care provider. Document Revised: 01/01/2021 Document Reviewed: 01/01/2021 Elsevier Patient Education  2024 [employer].

## 2023-05-28 NOTE — Progress Notes (Signed)
Tollie Eth, DNP, AGNP-c [facility] Medicine [address] [place], [place] [address] [phone]   ACUTE VISIT- ESTABLISHED PATIENT  Blood pressure 122/80, pulse 70, temperature 98.5 F (36.9 C), weight 130 lb 6.4 oz (59.1 kg).  Subjective:  HPI Alexandra Mcgrath is a 72 y.o. female presents to day for evaluation of acute concern(s).   "Sore throat/Swollen glands" [place] reports onset of sore throat last week. She tells me she experienced an upset stomach for 3 to 4 days as well.  She tells me that her throat feels inflamed and irritated as opposed to severe pain.  She denies any sinus symptoms or cough at this time.  She has no known sick contacts. She does report that she saw Dr. Gabriel Rung on Monday and he felt that she was dealing with a bacterial infection based off of muscle testing. She is concerned for a bacterial infection.   ROS negative except for what is listed in HPI. History, Medications, Surgery, SDOH, and Family History reviewed and updated as appropriate.  Objective:  Physical Exam Vitals and nursing note reviewed.  Constitutional:      General: She is not in acute distress.    Appearance: Normal appearance. She is not toxic-appearing.  HENT:     Head: Normocephalic and atraumatic.     Right Ear: Hearing and tympanic membrane normal.     Left Ear: Swelling and tenderness present. A middle ear effusion is present.     Nose: Nose normal.     Mouth/Throat:     Mouth: Mucous membranes are moist.     Pharynx: Oropharynx is clear. Posterior oropharyngeal erythema present.  Eyes:     Conjunctiva/sclera: Conjunctivae normal.     Pupils: Pupils are equal, round, and reactive to light.  Neck:     Vascular: No carotid bruit.  Cardiovascular:     Rate and Rhythm: Normal rate and regular rhythm.     Pulses: Normal pulses.     Heart sounds: Normal heart sounds.  Pulmonary:     Effort: Pulmonary effort is normal.     Breath sounds: Normal breath sounds.   Musculoskeletal:     Cervical back: Tenderness present.  Lymphadenopathy:     Cervical: No cervical adenopathy.  Skin:    General: Skin is warm and dry.     Capillary Refill: Capillary refill takes less than 2 seconds.  Neurological:     Mental Status: She is alert and oriented to person, place, and time.         Assessment & Plan:   Problem List Items Addressed This Visit     Non-recurrent acute suppurative otitis media of left ear without spontaneous rupture of tympanic membrane - Primary    Effusion present to the left ear with erythema noted. At this time there does appear to be a mild infection present. No lymphadenopathy present, but there is tenderness along the eustachian tube. We will go ahead and begin treatment with azithromycin. Strep and COVID tests negative today. If this does not clear with the antibiotic, I recommend giving it 2-3 additional days to clear. In the event this is viral, the antibiotic will not help and this will just take time.       Other Visit Diagnoses     Sore throat       Relevant Orders   Rapid Strep A (Completed)   POC COVID-19 (Completed)   Influenza A/B (Completed)   ANA positive       Relevant Orders  Anti-Sm Ab (RDL) (Completed)   Sjogren's syndrome antibods(ssa + ssb) (Completed)   Anti-U1 RNP Ab (RDL) (Completed)   Cardiolipin antibodies, IgM+IgG (Completed)   C3 and C4 (Completed)   Lupus anticoagulant panel (Completed)       Lab testing from previous visit performed.   Time: 33 minutes, >50% spent counseling, care coordination, chart review, and documentation.   Tollie Eth, DNP, AGNP-c

## 2023-05-31 ENCOUNTER — Encounter: Payer: Self-pay | Admitting: Nurse Practitioner

## 2023-05-31 NOTE — Assessment & Plan Note (Signed)
Patient reports snoring at night with significant daytime fatigue. This does cause concern for possible sleep apnea presence. We will plan to consider sleep study if we are unable to determine the causes through labs.

## 2023-05-31 NOTE — Assessment & Plan Note (Signed)
Recent diagnosis with biopsy from dermatology. Triamcinolone is working to help control this at this time. Continue current therapy and monitoring.

## 2023-05-31 NOTE — Assessment & Plan Note (Signed)
Chronic. Management with functional/integrative medicine at this time. She is not currently on medications. Will recheck labs today to determine if thyroid abnormalities could be the cause of increased fatigue. No changes today.

## 2023-05-31 NOTE — Assessment & Plan Note (Signed)
Ongoing fatigue of unknown cause. She has had IV fluids and unknown amounts of IV B12 with no improvement. She has concerns that additional autoimmune components could be causing the fatigue that continues. We discussed that this is possible given the presence of lichen planus and hashimotos, but there are times when laboratory analysis is not definitive. We will plan to obtain baseline labs today for further evaluation and make changes and testing as necessary based on findings.

## 2023-05-31 NOTE — Assessment & Plan Note (Signed)
Chronic. Will monitor labs. No alarm symptoms or symptoms present today

## 2023-06-03 ENCOUNTER — Telehealth: Payer: Self-pay | Admitting: Nurse Practitioner

## 2023-06-03 NOTE — Telephone Encounter (Signed)
Alexandra Mcgrath called and says she is not feeling 100% better and may need another round of antibiotics or whatever you think would be best. She also says she will be going out of town this Thursday for labor day. If you send anything in she uses  [id number] [place], [place] - [address]

## 2023-06-04 ENCOUNTER — Other Ambulatory Visit: Payer: Self-pay

## 2023-06-04 DIAGNOSIS — H66002 Acute suppurative otitis media without spontaneous rupture of ear drum, left ear: Secondary | ICD-10-CM | POA: Insufficient documentation

## 2023-06-04 MED ORDER — AZITHROMYCIN 250 MG PO TABS: ORAL_TABLET | ORAL | 0 refills | Status: AC

## 2023-06-04 MED ORDER — PREDNISONE 10 MG PO TABS: 10.0000 mg | ORAL_TABLET | Freq: Every day | ORAL | 0 refills | Status: DC

## 2023-06-04 NOTE — Telephone Encounter (Signed)
Can you call Alexandra Mcgrath to see if the antibiotics helped her feel better at all  If so, we can send in 3 days of azithromycin 250mg  once a day and prednisone 10mg  for 4 days in the morning.   If not, the infection is likely not bacterial and rather viral which means antibiotics will not help. Warm compresses, fluids, and the medications below are recommended.   Either way, I recommend taking generic xyzal (levocitirizine) at bedtime for the next week to help reduce the fluid. She may also use a nasal spray, such as fluticasone or mometasone to help reduce swelling in the eustachian tube.

## 2023-06-04 NOTE — Assessment & Plan Note (Signed)
Effusion present to the left ear with erythema noted. At this time there does appear to be a mild infection present. No lymphadenopathy present, but there is tenderness along the eustachian tube. We will go ahead and begin treatment with azithromycin. Strep and COVID tests negative today. If this does not clear with the antibiotic, I recommend giving it 2-3 additional days to clear. In the event this is viral, the antibiotic will not help and this will just take time.

## 2023-06-06 LAB — CARDIOLIPIN ANTIBODIES, IGM+IGG
Anticardiolipin IgG: 9 GPL U/mL (ref 0–14)
Anticardiolipin IgM: <9 [MPL'U]/mL (ref 0–12)

## 2023-06-06 LAB — SJOGREN'S SYNDROME ANTIBODS(SSA + SSB)
ENA SSA (RO) Ab: <0.2 AI (ref 0.0–0.9)
ENA SSB (LA) Ab: <0.2 AI (ref 0.0–0.9)

## 2023-06-06 LAB — LUPUS ANTICOAGULANT PANEL
Dilute Viper Venom Time: 28.7 s (ref 0.0–47.0)
PTT Lupus Anticoagulant: 34.8 s (ref 0.0–43.5)

## 2023-06-06 LAB — ANTI-SM AB (RDL): Anti-Sm Ab (RDL): <20 U (ref <20)

## 2023-06-06 LAB — ANTI-U1 RNP AB (RDL): Anti-U1 RNP Ab (RDL): <20 U (ref <20)

## 2023-06-06 LAB — C3 AND C4
Complement C3, Serum: 123 mg/dL (ref 82–167)
Complement C4, Serum: 23 mg/dL (ref 12–38)

## 2023-06-17 ENCOUNTER — Encounter: Payer: Self-pay | Admitting: Medical

## 2023-06-17 ENCOUNTER — Ambulatory Visit (INDEPENDENT_AMBULATORY_CARE_PROVIDER_SITE_OTHER): Payer: Medicare HMO | Admitting: Medical

## 2023-06-17 ENCOUNTER — Other Ambulatory Visit: Payer: Medicare HMO

## 2023-06-17 VITALS — BP 102/60 | HR 92 | Temp 99.1°F | Wt 128.8 lb

## 2023-06-17 DIAGNOSIS — H6122 Impacted cerumen, left ear: Secondary | ICD-10-CM

## 2023-06-17 DIAGNOSIS — J3489 Other specified disorders of nose and nasal sinuses: Secondary | ICD-10-CM

## 2023-06-17 DIAGNOSIS — R059 Cough, unspecified: Secondary | ICD-10-CM | POA: Diagnosis not present

## 2023-06-17 DIAGNOSIS — H68003 Unspecified Eustachian salpingitis, bilateral: Secondary | ICD-10-CM | POA: Diagnosis not present

## 2023-06-17 DIAGNOSIS — H9202 Otalgia, left ear: Secondary | ICD-10-CM

## 2023-06-17 LAB — POCT INFLUENZA A/B
Influenza A, POC: NEGATIVE
Influenza B, POC: NEGATIVE

## 2023-06-17 LAB — POC COVID19 BINAXNOW: SARS Coronavirus 2 Ag: NEGATIVE

## 2023-06-17 MED ORDER — PSEUDOEPHEDRINE HCL 30 MG PO TABS: 30.0000 mg | ORAL_TABLET | Freq: Two times a day (BID) | ORAL | 0 refills | Status: DC

## 2023-06-17 MED ORDER — DOXYCYCLINE HYCLATE 100 MG PO TABS
100.0000 mg | ORAL_TABLET | Freq: Two times a day (BID) | ORAL | 0 refills | Status: DC
Start: 2023-06-17 — End: 2024-05-31

## 2023-06-17 NOTE — Progress Notes (Signed)
Subjective:  Alexandra Mcgrath is a 72 y.o. female who presents for Chief Complaint  Patient presents with   Ear Pain    Still having left ear pain Les Pou told her she had an ear infection) and pressure. Last few days she had lots of sinus pain and pressure. Unproductive cough, worse at night-very dry. Shallow breaths when breathing. Her throat is very irritated, sore than gets better. Diagnosed with Lichen Planus in July and wonders if her immune system is having a hard time responding to the two courses of abx she has taken.      Here for ongoing illness symptoms.  Was seen here in August for same.  Still having ear discomfort, ache, sinuses feel worse with pressure.  Now has cough as well.  Feels worse, not better.   Throat still irritated.  Cough worse at night when lying down.    Sees functional doctor as well.  Using some oral drops to fight bacterial, herbal from her functional doctor.    No hx/o recurrent ear or sinus infection.  Has ENT, and has seen them prior for wax, and decreased hearing.  Using Claritin some, has xyzal, used for 3 days until she realized it had milk additive which she is allergic to.  Saw no difference in xyzal x 3 days vs Claritin.  No other aggravating or relieving factors.    No other c/o.  Past Medical History:  Diagnosis Date   Encounter for medical examination to establish care 11/05/2022   HLD (hyperlipidemia)    Current Outpatient Medications on File Prior to Visit  Medication Sig Dispense Refill   Ascorbic Acid (VITAMIN C) 1000 MG tablet Take 1,000 mg by mouth daily.     b complex vitamins capsule Take 1 capsule by mouth daily.     Cyanocobalamin (VITAMIN B-12 PO) Take 4 mcg by mouth daily.     estradiol (ESTRACE) 0.1 MG/GM vaginal cream      Multiple Vitamins-Minerals (MULTIVITAMIN ADULTS PO) Take 2 capsules by mouth daily.     NONFORMULARY OR COMPOUNDED ITEM Take 2 capsules by mouth daily.     NONFORMULARY OR COMPOUNDED ITEM Take 3  tablets by mouth daily.     NONFORMULARY OR COMPOUNDED ITEM Take 3 capsules by mouth daily.     NONFORMULARY OR COMPOUNDED ITEM Take 6-7 drops by mouth daily.     Omega-3 Fatty Acids (FISH OIL PO) Take 2 each by mouth daily.     Red Yeast Rice Extract (RED YEAST RICE PO) Take 400-600 mg by mouth daily.     NONFORMULARY OR COMPOUNDED ITEM Take 2 tablets by mouth daily. (Patient not taking: Reported on 06/17/2023)     triamcinolone ointment (KENALOG) 0.1 % Apply 1 Application topically 2 (two) times daily. (Patient not taking: Reported on 06/17/2023)     VITAMIN D, CHOLECALCIFEROL, PO Take 7,000 Int'l Units by mouth daily. (Patient not taking: Reported on 06/17/2023)     No current facility-administered medications on file prior to visit.   The following portions of the patient's history were reviewed and updated as appropriate: allergies, current medications, past family history, past medical history, past social history, past surgical history and problem list.  ROS Otherwise as in subjective above    Objective: BP 102/60   Pulse 92   Temp 99.1 F (37.3 C)   Wt 128 lb 12.8 oz (58.4 kg)   SpO2 98%   BMI 22.46 kg/m   General appearance: alert, no distress, well developed,  well nourished HEENT: normocephalic, sclerae anicteric, conjunctiva pink and moist, right TM with mild serous fluid, left TM not visualized due to cerumen,  nares patent, no discharge or erythema, pharynx with mild erythema Oral cavity: MMM, no lesions, teeth in good repair Neck: supple, no lymphadenopathy, no thyromegaly, no masses Lungs: CTA bilaterally, no wheezes, rhonchi, or rales    Assessment: Encounter Diagnoses  Name Primary?   Cough, unspecified type Yes   Left ear pain    Sinus pressure    Salpingitis of both eustachian tubes    Impacted cerumen of left ear      Plan: We discussed symptoms and concerns and exam findings.  Discussed findings.  Discussed risk/benefits of procedure and patient  agrees to procedure. Successfully used warm water lavage to remove impacted cerumen from left ear canal. Patient tolerated procedure well. Advised they avoid using any cotton swabs or other devices to clean the ear canals.  Use basic hygiene as discussed.  Follow up prn.   She has recently completed 2 rounds of Z-Pak and some prednisone.  Exam suggests more of a eustachian tubes dysfunction issue.  Recommendations: Continue good hydration at least 100 ounces of water daily Consider 3 to 5 days of Sudafed decongestant twice daily Consider continuing Claritin for the time being  daily as we are entering fall allergy season Consider either allergy nasal sprays such as Flonase daily over-the-counter for the next 1 to 2 weeks or you can do short-term Afrin nasal decongestant for 3 days or less up to twice daily You can use salt water gargles for any throat irritation or drainage The postnasal drainage from the eustachian tube is probably causing the cough You can either use over-the-counter cough syrup such as Delsym or cough drops or I can call out a prescription cough drops called Tessalon Perles  If worse or not improving over the next week particularly worse sinus ear pain we can consider a third different antibiotic such as doxycycline.    Teva was seen today for ear pain.  Diagnoses and all orders for this visit:  Cough, unspecified type -     POC COVID-19 -     Influenza A/B  Left ear pain  Sinus pressure  Salpingitis of both eustachian tubes  Impacted cerumen of left ear  Other orders -     pseudoephedrine (SUDAFED) 30 MG tablet; Take 1 tablet (30 mg total) by mouth in the morning and at bedtime. -     doxycycline (VIBRA-TABS) 100 MG tablet; Take 1 tablet (100 mg total) by mouth 2 (two) times daily.    Follow up: prn

## 2023-11-13 LAB — HM MAMMOGRAPHY

## 2023-12-09 ENCOUNTER — Ambulatory Visit (INDEPENDENT_AMBULATORY_CARE_PROVIDER_SITE_OTHER): Admitting: Family Medicine

## 2023-12-09 ENCOUNTER — Encounter: Payer: Self-pay | Admitting: Family Medicine

## 2023-12-09 VITALS — BP 118/72 | HR 89 | Wt 135.4 lb

## 2023-12-09 DIAGNOSIS — J0100 Acute maxillary sinusitis, unspecified: Secondary | ICD-10-CM

## 2023-12-09 DIAGNOSIS — J302 Other seasonal allergic rhinitis: Secondary | ICD-10-CM | POA: Diagnosis not present

## 2023-12-09 MED ORDER — CLARITHROMYCIN 500 MG PO TABS
500.0000 mg | ORAL_TABLET | Freq: Two times a day (BID) | ORAL | 0 refills | Status: DC
Start: 2023-12-09 — End: 2024-05-31

## 2023-12-09 NOTE — Progress Notes (Signed)
   Subjective:    Patient ID: Alexandra Mcgrath, female    DOB: 26-Sep-1951, 73 y.o.   MRN: [id number]  HPI She states that approximately 10 days ago she developed a slight sore throat with some postnasal drainage, sinus pressure but no fever.  She did use supplements and thinks that it helped for a while but in the last 5 days she has noted worsening of the sore throat, postnasal drainage as well as sinus pressure.  She does have an underlying history of allergies and does use Claritin.   Review of Systems     Objective:    Physical Exam Alert and in no distress. Tympanic membranes and canals are normal.  Nasal mucosa slightly erythematous with slight tenderness over maxillary sinuses pharyngeal area is normal. Neck is supple without adenopathy or thyromegaly. Cardiac exam shows a regular sinus rhythm without murmurs or gallops. Lungs are clear to auscultation.        Assessment & Plan:  Acute maxillary sinusitis, recurrence not specified - Plan: clarithromycin (BIAXIN) 500 MG tablet  Seasonal allergic rhinitis, unspecified trigger Take all the antibiotic and if not totally better when you finish, leave a message on MyChart At the end of the encounter she mentions the fact that she is still having some slight difficulty with lower extremity swelling but no other symptoms.  Recommend that she talk this further with Sarabeth.

## 2023-12-09 NOTE — Patient Instructions (Signed)
 Take all the antibiotic and if not totally better when you finish, leave a message on MyChart

## 2024-02-17 ENCOUNTER — Ambulatory Visit (INDEPENDENT_AMBULATORY_CARE_PROVIDER_SITE_OTHER): Payer: Medicare HMO

## 2024-02-17 DIAGNOSIS — Z0000 Encounter for general adult medical examination without abnormal findings: Secondary | ICD-10-CM | POA: Diagnosis not present

## 2024-02-17 NOTE — Progress Notes (Signed)
 Subjective:   Alexandra Mcgrath is a 73 y.o. who presents for a Medicare Wellness preventive visit.  As a reminder, Annual Wellness Visits don't include a physical exam, and some assessments may be limited, especially if this visit is performed virtually. We may recommend an in-person visit if needed.  Visit Complete: Virtual I connected with  Alexandra Mcgrath on 02/17/24 by a audio enabled telemedicine application and verified that I am speaking with the correct person using two identifiers.  Patient Location: Home  Provider Location: Home Office  I discussed the limitations of evaluation and management by telemedicine. The patient expressed understanding and agreed to proceed.  Vital Signs: Because this visit was a virtual/telehealth visit, some criteria may be missing or patient reported. Any vitals not documented were not able to be obtained and vitals that have been documented are patient reported.  VideoError- Librarian, academic were attempted between this provider and patient, however failed, due to patient having technical difficulties OR patient did not have access to video capability.  We continued and completed visit with audio only.   Persons Participating in Visit: Patient.  AWV Questionnaire: No: Patient Medicare AWV questionnaire was not completed prior to this visit.  Cardiac Risk Factors include: advanced age (>18men, >70 women);dyslipidemia     Objective:     Today's Vitals   There is no height or weight on file to calculate BMI.     02/17/2024    3:44 PM 02/11/2023    3:36 PM 11/29/2014    8:09 AM 11/22/2014    2:38 PM  Advanced Directives  Does Patient Have a Medical Advance Directive? Yes Yes Yes Yes  Type of Estate agent of [place];Living will Healthcare Power of [place];Living will Healthcare Power of [place];Living will Healthcare Power of [place];Living will  Does patient want to make changes to  medical advance directive?    No - Patient declined  Copy of Healthcare Power of Attorney in Chart? No - copy requested No - copy requested  No - copy requested    Current Medications (verified) Outpatient Encounter Medications as of 02/17/2024  Medication Sig   Ascorbic Acid (VITAMIN C) 1000 MG tablet Take 1,000 mg by mouth daily.   b complex vitamins capsule Take 1 capsule by mouth daily.   Cyanocobalamin (VITAMIN B-12 PO) Take 4 mcg by mouth daily.   estradiol (ESTRACE) 0.1 MG/GM vaginal cream    Multiple Vitamins-Minerals (MULTIVITAMIN ADULTS PO) Take 2 capsules by mouth daily.   NONFORMULARY OR COMPOUNDED ITEM Take 2 tablets by mouth daily.   NONFORMULARY OR COMPOUNDED ITEM Take 2 capsules by mouth daily.   NONFORMULARY OR COMPOUNDED ITEM Take 3 tablets by mouth daily.   NONFORMULARY OR COMPOUNDED ITEM Take 3 capsules by mouth daily.   NONFORMULARY OR COMPOUNDED ITEM Take 6-7 drops by mouth daily.   Omega-3 Fatty Acids (FISH OIL PO) Take 2 each by mouth daily.   triamcinolone ointment (KENALOG) 0.1 % Apply 1 Application topically 2 (two) times daily. As needed   VITAMIN D , CHOLECALCIFEROL, PO Take 7,000 Int'l Units by mouth daily.   clarithromycin  (BIAXIN ) 500 MG tablet Take 1 tablet (500 mg total) by mouth 2 (two) times daily. (Patient not taking: Reported on 02/17/2024)   doxycycline  (VIBRA -TABS) 100 MG tablet Take 1 tablet (100 mg total) by mouth 2 (two) times daily. (Patient not taking: Reported on 02/17/2024)   pseudoephedrine  (SUDAFED) 30 MG tablet Take 1 tablet (30 mg total) by mouth in  the morning and at bedtime. (Patient not taking: Reported on 02/17/2024)   Red Yeast Rice Extract (RED YEAST RICE PO) Take 400-600 mg by mouth daily. (Patient not taking: Reported on 02/17/2024)   No facility-administered encounter medications on file as of 02/17/2024.    Allergies (verified) Milk protein, Sulfa antibiotics, Penicillins, and Tomato   History: Past Medical History:  Diagnosis  Date   Encounter for medical examination to establish care 11/05/2022   HLD (hyperlipidemia)    Past Surgical History:  Procedure Laterality Date   ABDOMINAL HYSTERECTOMY     partial   CHOLECYSTECTOMY     COLONOSCOPY WITH PROPOFOL  N/A 11/29/2014   Procedure: COLONOSCOPY WITH PROPOFOL ;  Surgeon: Garrett Kallman, MD;  Location: [facility];  Service: Endoscopy;  Laterality: N/A;   Family History  Problem Relation Age of Onset   Diabetes Mother    Heart disease Mother    Heart failure Mother    Breast cancer Mother 66   Diabetes Father    Heart disease Father    Social History   Socioeconomic History   Marital status: Widowed    Spouse name: Not on file   Number of children: Not on file   Years of education: Not on file   Highest education level: Not on file  Occupational History   Not on file  Tobacco Use   Smoking status: Never   Smokeless tobacco: Never  Vaping Use   Vaping status: Never Used  Substance and Sexual Activity   Alcohol use: Not Currently    Comment: rarely   Drug use: No   Sexual activity: Not Currently    Comment: 1st intercourse 73 yo-Fewer than 5 partners  Other Topics Concern   Not on file  Social History Narrative   Not on file   Social Drivers of Health   Financial Resource Strain: Low Risk  (02/17/2024)   Overall Financial Resource Strain (CARDIA)    Difficulty of Paying Living Expenses: Not hard at all  Food Insecurity: No Food Insecurity (02/17/2024)   Hunger Vital Sign    Worried About Running Out of Food in the Last Year: Never true    Ran Out of Food in the Last Year: Never true  Transportation Needs: No Transportation Needs (02/17/2024)   PRAPARE - Administrator, Civil Service (Medical): No    Lack of Transportation (Non-Medical): No  Physical Activity: Sufficiently Active (02/17/2024)   Exercise Vital Sign    Days of Exercise per Week: 3 days    Minutes of Exercise per Session: 60 min  Stress: No Stress Concern  Present (02/17/2024)   [employer] of Occupational Health - Occupational Stress Questionnaire    Feeling of Stress : Only a little  Social Connections: Moderately Isolated (02/17/2024)   Social Connection and Isolation Panel [NHANES]    Frequency of Communication with Friends and Family: More than three times a week    Frequency of Social Gatherings with Friends and Family: More than three times a week    Attends Religious Services: More than 4 times per year    Active Member of [employer] or Organizations: No    Attends Banker Meetings: Never    Marital Status: Widowed    Tobacco Counseling Counseling given: Not Answered    Clinical Intake:  Pre-visit preparation completed: Yes  Pain : No/denies pain     Nutritional Risks: None Diabetes: No  No results found for: "HGBA1C"   How often do you need  to have someone help you when you read instructions, pamphlets, or other written materials from your doctor or pharmacy?: 1 - Never  Interpreter Needed?: No  Information entered by :: NAllen LPN   Activities of Daily Living     02/17/2024    3:31 PM  In your present state of health, do you have any difficulty performing the following activities:  Hearing? 1  Comment somewhat but don't need hearing aid yet  Vision? 0  Difficulty concentrating or making decisions? 0  Walking or climbing stairs? 0  Dressing or bathing? 0  Doing errands, shopping? 0  Preparing Food and eating ? N  Using the Toilet? N  In the past six months, have you accidently leaked urine? Y  Comment incontinence  Do you have problems with loss of bowel control? N  Comment sometimes  Managing your Medications? N  Managing your Finances? N  Housekeeping or managing your Housekeeping? N    Patient Care Team: Early, Sara E, NP as PCP - General (Nurse Practitioner)  Indicate any recent Medical Services you may have received from other than [facility] providers in the past year (date may be  approximate).     Assessment:    This is a routine wellness examination for Jake.  Hearing/Vision screen Hearing Screening - Comments:: Slight hearing issues , but no hearing aids Vision Screening - Comments:: Regular eye exams, [facility]   Goals Addressed             This Visit's Progress    Patient Stated       02/17/2024, continue healthy lifestyle       Depression Screen     02/17/2024    3:46 PM 02/11/2023    3:37 PM 11/05/2022   11:23 AM  PHQ 2/9 Scores  PHQ - 2 Score 0 0 0  PHQ- 9 Score 2 0     Fall Risk     02/17/2024    3:45 PM 02/11/2023    3:37 PM 11/05/2022   11:23 AM  Fall Risk   Falls in the past year? 0 0 0  Number falls in past yr: 0 0 0  Injury with Fall? 0 0 0  Risk for fall due to : No Fall Risks No Fall Risks No Fall Risks  Follow up Falls prevention discussed;Falls evaluation completed Falls prevention discussed;Education provided;Falls evaluation completed Falls evaluation completed    MEDICARE RISK AT HOME:  Medicare Risk at Home Any stairs in or around the home?: Yes If so, are there any without handrails?: No Home free of loose throw rugs in walkways, pet beds, electrical cords, etc?: Yes Adequate lighting in your home to reduce risk of falls?: Yes Life alert?: No Use of a cane, walker or w/c?: No Grab bars in the bathroom?: No Shower chair or bench in shower?: No Elevated toilet seat or a handicapped toilet?: Yes  TIMED UP AND GO:  Was the test performed?  No  Cognitive Function: 6CIT completed        02/17/2024    3:47 PM 02/11/2023    3:43 PM  6CIT Screen  What Year? 0 points 0 points  What month? 0 points 0 points  What time? 0 points 0 points  Count back from 20 0 points 0 points  Months in reverse 0 points 0 points  Repeat phrase 0 points 2 points  Total Score 0 points 2 points    Immunizations  There is no immunization history on file for  this patient.  Screening Tests Health Maintenance  Topic Date Due    COVID-19 Vaccine (1) Never done   DTaP/Tdap/Td (1 - Tdap) Never done   Zoster Vaccines- Shingrix (1 of 2) Never done   Pneumonia Vaccine 35+ Years old (1 of 1 - PCV) Never done   MAMMOGRAM  05/30/2024 (Originally 11/04/2020)   INFLUENZA VACCINE  05/07/2024   Medicare Annual Wellness (AWV)  02/16/2025   Colonoscopy  10/24/2032   DEXA SCAN  Completed   HPV VACCINES  Aged Out   Meningococcal B Vaccine  Aged Out   Hepatitis C Screening  Discontinued    Health Maintenance  Health Maintenance Due  Topic Date Due   COVID-19 Vaccine (1) Never done   DTaP/Tdap/Td (1 - Tdap) Never done   Zoster Vaccines- Shingrix (1 of 2) Never done   Pneumonia Vaccine 65+ Years old (1 of 1 - PCV) Never done   Health Maintenance Items Addressed: Declines vaccines. Requesting mammogram report from [facility]  Additional Screening:  Vision Screening: Recommended annual ophthalmology exams for early detection of glaucoma and other disorders of the eye.  Dental Screening: Recommended annual dental exams for proper oral hygiene  Community Resource Referral / Chronic Care Management: CRR required this visit?  No   CCM required this visit?  No   Plan:    I have personally reviewed and noted the following in the patient's chart:   Medical and social history Use of alcohol, tobacco or illicit drugs  Current medications and supplements including opioid prescriptions. Patient is not currently taking opioid prescriptions. Functional ability and status Nutritional status Physical activity Advanced directives List of other physicians Hospitalizations, surgeries, and ER visits in previous 12 months Vitals Screenings to include cognitive, depression, and falls Referrals and appointments  In addition, I have reviewed and discussed with patient certain preventive protocols, quality metrics, and best practice recommendations. A written personalized care plan for preventive services as well as  general preventive health recommendations were provided to patient.   Areatha Beecham, LPN   06/04/5620   After Visit Summary: (MyChart) Due to this being a telephonic visit, the after visit summary with patients personalized plan was offered to patient via MyChart   Notes: Nothing significant to report at this time.

## 2024-02-17 NOTE — Patient Instructions (Signed)
 Ms. Gennett , Thank you for taking time out of your busy schedule to complete your Annual Wellness Visit with me. I enjoyed our conversation and look forward to speaking with you again next year. I, as well as your care team,  appreciate your ongoing commitment to your health goals. Please review the following plan we discussed and let me know if I can assist you in the future. Your Game plan/ To Do List    Referrals: If you haven't heard from the office you've been referred to, please reach out to them at the phone provided.  N/a Follow up Visits: Next Medicare AWV with our clinical staff: 02/22/2025 at 2:10   Have you seen your provider in the last 6 months (3 months if uncontrolled diabetes)? Yes Next Office Visit with your provider: will call to schedule appointment at a later time  Clinician Recommendations:  Aim for 30 minutes of exercise or brisk walking, 6-8 glasses of water, and 5 servings of fruits and vegetables each day.       This is a list of the screening recommended for you and due dates:  Health Maintenance  Topic Date Due   COVID-19 Vaccine (1) Never done   DTaP/Tdap/Td vaccine (1 - Tdap) Never done   Zoster (Shingles) Vaccine (1 of 2) Never done   Pneumonia Vaccine (1 of 1 - PCV) Never done   Mammogram  05/30/2024*   Flu Shot  05/07/2024   Medicare Annual Wellness Visit  02/16/2025   Colon Cancer Screening  10/24/2032   DEXA scan (bone density measurement)  Completed   HPV Vaccine  Aged Out   Meningitis B Vaccine  Aged Out   Hepatitis C Screening  Discontinued  *Topic was postponed. The date shown is not the original due date.    Advanced directives: (Copy Requested) Please bring a copy of your health care power of attorney and living will to the office to be added to your chart at your convenience. You can mail to [facility] [address] [place], [place] [address] or email to [email or web address] Advance Care Planning is important because  it:  [x]  Makes sure you receive the medical care that is consistent with your values, goals, and preferences  [x]  It provides guidance to your family and loved ones and reduces their decisional burden about whether or not they are making the right decisions based on your wishes.  Follow the link provided in your after visit summary or read over the paperwork we have mailed to you to help you started getting your Advance Directives in place. If you need assistance in completing these, please reach out to us  so that we can help you!  See attachments for Preventive Care and Fall Prevention Tips.

## 2024-05-07 ENCOUNTER — Encounter: Payer: Self-pay | Admitting: Internal Medicine

## 2024-05-21 ENCOUNTER — Encounter: Payer: Self-pay | Admitting: Nurse Practitioner

## 2024-05-21 ENCOUNTER — Ambulatory Visit (INDEPENDENT_AMBULATORY_CARE_PROVIDER_SITE_OTHER): Admitting: Nurse Practitioner

## 2024-05-21 VITALS — BP 128/80 | HR 100 | Wt 127.6 lb

## 2024-05-21 DIAGNOSIS — E063 Autoimmune thyroiditis: Secondary | ICD-10-CM

## 2024-05-21 DIAGNOSIS — L659 Nonscarring hair loss, unspecified: Secondary | ICD-10-CM | POA: Diagnosis not present

## 2024-05-21 DIAGNOSIS — L74513 Primary focal hyperhidrosis, soles: Secondary | ICD-10-CM

## 2024-05-21 DIAGNOSIS — R197 Diarrhea, unspecified: Secondary | ICD-10-CM

## 2024-05-21 DIAGNOSIS — L609 Nail disorder, unspecified: Secondary | ICD-10-CM | POA: Diagnosis not present

## 2024-05-21 DIAGNOSIS — R6889 Other general symptoms and signs: Secondary | ICD-10-CM

## 2024-05-21 DIAGNOSIS — M25511 Pain in right shoulder: Secondary | ICD-10-CM

## 2024-05-21 NOTE — Progress Notes (Signed)
 Camie FORBES Doing, DNP, AGNP-c [facility] [address] [place], [place] [address] [phone]   ACUTE VISIT on 05/21/2024  Blood pressure 128/80, pulse 100, weight 127 lb 9.6 oz (57.9 kg).  Subjective:  HPI  History of Present Illness Alexandra Mcgrath is a 73 year old female who presents with gastrointestinal symptoms and concerns about a possible parasitic infection following international travel.  Her symptoms began approximately six weeks ago after returning from a two-week trip abroad. Initially, she was muscle tested by [facility], who identified a parasite, and she was started on a treatment regimen. Initially, she was on three doses per day, which was reduced to two, and then to one over the past week. Her symptoms, including bloating, diarrhea, cramping, and pain, have started to return since the dosage was decreased.  She mentions that her hair and nails have been affected during this time, with her hair falling out and her nails peeling in layers. She is unsure if these symptoms are related to the treatment or the parasite itself. She has not yet discussed these symptoms with Joe.  She has been taking a supplement called Bio B for swelling in her ankles and legs, which has improved the swelling but caused her feet to sweat excessively. Initially, she was on six doses per day, which has been reduced to three, resulting in some improvement in the sweating.  She describes a significant amount of walking during her trip, which exacerbated her symptoms. She also mentions a fall during a hike, resulting in knee and shoulder injuries. The knee has mostly healed, but she continues to experience shoulder pain, particularly when lifting her arm, which she attributes to a possible muscle issue.  She is concerned about the possibility of thyroid  or diabetes issues due to her symptoms of sweating feet. She has not had her blood sugar checked since last year.  She is also concerned  about the possibility of anemia due to the parasitic infection, as she has read that parasites can cause malabsorption and anemia.  Artemisia annua, garlic, black walnut, citrus extract, pumpkin, bromelain, papain, oil of oregano, vitamin c for parasite infection.   ROS negative except for what is listed in HPI. History, Medications, Surgery, SDOH, and Family History reviewed and updated as appropriate.  Objective:  Physical Exam Vitals and nursing note reviewed.  Constitutional:      General: She is not in acute distress.    Appearance: Normal appearance. She is not ill-appearing.  HENT:     Head: Normocephalic.  Eyes:     Conjunctiva/sclera: Conjunctivae normal.     Pupils: Pupils are equal, round, and reactive to light.  Neck:     Vascular: No carotid bruit.  Cardiovascular:     Rate and Rhythm: Normal rate and regular rhythm.     Pulses: Normal pulses.     Heart sounds: Normal heart sounds.  Pulmonary:     Effort: Pulmonary effort is normal.     Breath sounds: Normal breath sounds.  Abdominal:     General: Bowel sounds are normal. There is no distension.     Palpations: Abdomen is soft.     Tenderness: There is no abdominal tenderness. There is no guarding.  Musculoskeletal:     Cervical back: Neck supple.     Right lower leg: No edema.     Left lower leg: No edema.  Skin:    General: Skin is warm and dry.     Capillary Refill: Capillary refill takes  less than 2 seconds.  Neurological:     Mental Status: She is alert and oriented to person, place, and time.  Psychiatric:        Mood and Affect: Mood normal.         Assessment & Plan:   Problem List Items Addressed This Visit     Hashimoto's disease   Chronic. Management with functional/integrative medicine at this time. She is not currently on medications. Will recheck labs today. No changes today.      Relevant Orders   Hemoglobin A1c (Completed)   TSH (Completed)   T4, free (Completed)   Ova and parasite  examination (Completed)   CBC with Differential/Platelet (Completed)   Comprehensive metabolic panel with GFR (Completed)   Iron, TIBC and Ferritin Panel (Completed)   VITAMIN D  25 Hydroxy (Vit-D Deficiency, Fractures) (Completed)   Hyperhidrosis of feet - Primary   Increased sweating of feet noted after starting high-dose B vitamin supplementation. Symptoms improved with dose reduction from six to three capsules per day. Likely related to high B vitamin intake. - Continue current B vitamin dose and monitor symptoms. - Consider further dose reduction if sweating persists.      Relevant Orders   Hemoglobin A1c (Completed)   TSH (Completed)   T4, free (Completed)   Ova and parasite examination (Completed)   CBC with Differential/Platelet (Completed)   Comprehensive metabolic panel with GFR (Completed)   Iron, TIBC and Ferritin Panel (Completed)   VITAMIN D  25 Hydroxy (Vit-D Deficiency, Fractures) (Completed)   Hair loss   Hair thinning and nail peeling possibly related to nutritional deficiency or malabsorption due to gastrointestinal symptoms. Symptoms improved with B vitamin supplementation. - Order complete blood count, metabolic panel, thyroid  function tests, hemoglobin A1c, iron studies, and vitamin D  level to assess for nutritional deficiencies and other potential causes.       Relevant Orders   Hemoglobin A1c (Completed)   TSH (Completed)   T4, free (Completed)   Ova and parasite examination (Completed)   CBC with Differential/Platelet (Completed)   Comprehensive metabolic panel with GFR (Completed)   Iron, TIBC and Ferritin Panel (Completed)   VITAMIN D  25 Hydroxy (Vit-D Deficiency, Fractures) (Completed)   Diarrhea of presumed infectious origin   Symptoms of bloating, diarrhea, and cramping began after returning from a trip abroad. Initial high-dose treatment improved symptoms, but recurrence occurred with dose reduction. Possible malabsorption contributing to hair and nail  changes. Differential includes ongoing parasitic infection with adult parasites killed but eggs hatching causing recurrent symptoms. - Order stool test for parasites including giardia, cryptosporidium, cyclospora, and other pathogens.      Relevant Orders   TSH (Completed)   T4, free (Completed)   Ova and parasite examination (Completed)   CBC with Differential/Platelet (Completed)   Comprehensive metabolic panel with GFR (Completed)   Iron, TIBC and Ferritin Panel (Completed)   VITAMIN D  25 Hydroxy (Vit-D Deficiency, Fractures) (Completed)   Acute pain of right shoulder   Persistent right shoulder pain following a fall during a hiking trip. Pain localized to the biceps tendon area, with concern for rotator cuff involvement. Risk of frozen shoulder if not addressed. Pain exacerbated by lifting motion. - Provide specific shoulder stretching exercises to prevent frozen shoulder. - Advise regular performance of exercises and monitor for improvement. - Consider referral to sports medicine specialist if no improvement with exercises.      Nail disorder   Relevant Orders   Hemoglobin A1c (Completed)   TSH (Completed)  T4, free (Completed)   Ova and parasite examination (Completed)   CBC with Differential/Platelet (Completed)   Comprehensive metabolic panel with GFR (Completed)   Iron, TIBC and Ferritin Panel (Completed)   VITAMIN D  25 Hydroxy (Vit-D Deficiency, Fractures) (Completed)   Other Visit Diagnoses       Nonspecific abnormal finding       Relevant Orders   Hemoglobin A1c (Completed)   TSH (Completed)   T4, free (Completed)   Ova and parasite examination (Completed)   CBC with Differential/Platelet (Completed)   Comprehensive metabolic panel with GFR (Completed)   Iron, TIBC and Ferritin Panel (Completed)   VITAMIN D  25 Hydroxy (Vit-D Deficiency, Fractures) (Completed)       Camie FORBES Doing, DNP, AGNP-c Time: 46 minutes, >50% spent counseling, care coordination, chart  review, and documentation.

## 2024-05-21 NOTE — Patient Instructions (Addendum)
 I have ordered the lab tests. You can come back here to have this done or go to one of the [facility].

## 2024-05-24 ENCOUNTER — Other Ambulatory Visit

## 2024-05-25 LAB — CBC WITH DIFFERENTIAL/PLATELET
Basophils Absolute: 0.1 x10E3/uL (ref 0.0–0.2)
Basos: 1 %
EOS (ABSOLUTE): 0.2 x10E3/uL (ref 0.0–0.4)
Eos: 3 %
Hematocrit: 40 % (ref 34.0–46.6)
Hemoglobin: 13.1 g/dL (ref 11.1–15.9)
Immature Grans (Abs): 0 x10E3/uL (ref 0.0–0.1)
Immature Granulocytes: 0 %
Lymphocytes Absolute: 1.8 x10E3/uL (ref 0.7–3.1)
Lymphs: 25 %
MCH: 32.6 pg (ref 26.6–33.0)
MCHC: 32.8 g/dL (ref 31.5–35.7)
MCV: 100 fL — ABNORMAL HIGH (ref 79–97)
Monocytes Absolute: 0.4 x10E3/uL (ref 0.1–0.9)
Monocytes: 6 %
Neutrophils Absolute: 4.5 x10E3/uL (ref 1.4–7.0)
Neutrophils: 65 %
Platelets: 266 x10E3/uL (ref 150–450)
RBC: 4.02 x10E6/uL (ref 3.77–5.28)
RDW: 11.7 % (ref 11.7–15.4)
WBC: 6.9 x10E3/uL (ref 3.4–10.8)

## 2024-05-25 LAB — COMPREHENSIVE METABOLIC PANEL WITH GFR
ALT: 15 IU/L (ref 0–32)
AST: 13 IU/L (ref 0–40)
Albumin: 4.1 g/dL (ref 3.8–4.8)
Alkaline Phosphatase: 43 IU/L — AB (ref 44–121)
BUN/Creatinine Ratio: 22 (ref 12–28)
BUN: 17 mg/dL (ref 8–27)
Bilirubin Total: 0.3 mg/dL (ref 0.0–1.2)
CO2: 23 mmol/L (ref 20–29)
Calcium: 9.2 mg/dL (ref 8.7–10.3)
Chloride: 106 mmol/L (ref 96–106)
Creatinine, Ser: 0.77 mg/dL (ref 0.57–1.00)
Globulin, Total: 1.8 g/dL (ref 1.5–4.5)
Glucose: 115 mg/dL — AB (ref 70–99)
Potassium: 4.4 mmol/L (ref 3.5–5.2)
Sodium: 143 mmol/L (ref 134–144)
Total Protein: 5.9 g/dL — AB (ref 6.0–8.5)
eGFR: 81 mL/min/1.73 (ref >59)

## 2024-05-25 LAB — T4, FREE: Free T4: 1.16 ng/dL (ref 0.82–1.77)

## 2024-05-25 LAB — VITAMIN D 25 HYDROXY (VIT D DEFICIENCY, FRACTURES): Vit D, 25-Hydroxy: 41.2 ng/mL (ref 30.0–100.0)

## 2024-05-25 LAB — IRON,TIBC AND FERRITIN PANEL
Ferritin: 116 ng/mL (ref 15–150)
Iron Saturation: 36 (ref 15–55)
Iron: 110 ug/dL (ref 27–139)
Total Iron Binding Capacity: 309 ug/dL (ref 250–450)
UIBC: 199 ug/dL (ref 118–369)

## 2024-05-25 LAB — TSH: TSH: 1.12 u[IU]/mL (ref 0.450–4.500)

## 2024-05-25 LAB — HEMOGLOBIN A1C
Est. average glucose Bld gHb Est-mCnc: 117 mg/dL
Hgb A1c MFr Bld: 5.7 % — ABNORMAL HIGH (ref 4.8–5.6)

## 2024-05-26 ENCOUNTER — Ambulatory Visit: Payer: Self-pay | Admitting: Nurse Practitioner

## 2024-05-26 DIAGNOSIS — R7303 Prediabetes: Secondary | ICD-10-CM | POA: Insufficient documentation

## 2024-05-26 LAB — OVA AND PARASITE EXAMINATION

## 2024-05-31 ENCOUNTER — Telehealth: Payer: Self-pay

## 2024-05-31 ENCOUNTER — Ambulatory Visit: Admitting: Nurse Practitioner

## 2024-05-31 DIAGNOSIS — M25511 Pain in right shoulder: Secondary | ICD-10-CM | POA: Insufficient documentation

## 2024-05-31 NOTE — Telephone Encounter (Signed)
 Called pt. Had to LM again to go over labs with the pt.   Copied from CRM [id number]. Topic: Clinical - Lab/Test Results >> May 28, 2024  3:10 PM Alexandra Mcgrath wrote: Reason for CRM: Patient called in to return call to Jayma Volpi for lab results

## 2024-05-31 NOTE — Assessment & Plan Note (Signed)
 Hair thinning and nail peeling possibly related to nutritional deficiency or malabsorption due to gastrointestinal symptoms. Symptoms improved with B vitamin supplementation. - Order complete blood count, metabolic panel, thyroid  function tests, hemoglobin A1c, iron studies, and vitamin D  level to assess for nutritional deficiencies and other potential causes.

## 2024-05-31 NOTE — Telephone Encounter (Signed)
 Copied from CRM #[id number]. Topic: General - Other >> May 31, 2024  1:00 PM Debby BROCKS wrote: Reason for CRM: Patient missed a call from Efrain Juliene BIRCH, RMA to read out the test results. Would like for him to call again

## 2024-05-31 NOTE — Assessment & Plan Note (Signed)
 Increased sweating of feet noted after starting high-dose B vitamin supplementation. Symptoms improved with dose reduction from six to three capsules per day. Likely related to high B vitamin intake. - Continue current B vitamin dose and monitor symptoms. - Consider further dose reduction if sweating persists.

## 2024-05-31 NOTE — Assessment & Plan Note (Signed)
 Chronic. Management with functional/integrative medicine at this time. She is not currently on medications. Will recheck labs today. No changes today.

## 2024-05-31 NOTE — Assessment & Plan Note (Signed)
 Symptoms of bloating, diarrhea, and cramping began after returning from a trip abroad. Initial high-dose treatment improved symptoms, but recurrence occurred with dose reduction. Possible malabsorption contributing to hair and nail changes. Differential includes ongoing parasitic infection with adult parasites killed but eggs hatching causing recurrent symptoms. - Order stool test for parasites including giardia, cryptosporidium, cyclospora, and other pathogens.

## 2024-05-31 NOTE — Assessment & Plan Note (Signed)
 Persistent right shoulder pain following a fall during a hiking trip. Pain localized to the biceps tendon area, with concern for rotator cuff involvement. Risk of frozen shoulder if not addressed. Pain exacerbated by lifting motion. - Provide specific shoulder stretching exercises to prevent frozen shoulder. - Advise regular performance of exercises and monitor for improvement. - Consider referral to sports medicine specialist if no improvement with exercises.

## 2024-06-16 DIAGNOSIS — N8189 Other female genital prolapse: Secondary | ICD-10-CM | POA: Diagnosis not present

## 2024-06-16 DIAGNOSIS — R350 Frequency of micturition: Secondary | ICD-10-CM | POA: Diagnosis not present

## 2024-06-16 DIAGNOSIS — N3946 Mixed incontinence: Secondary | ICD-10-CM | POA: Diagnosis not present

## 2024-07-01 ENCOUNTER — Ambulatory Visit: Admitting: Internal Medicine

## 2024-07-01 ENCOUNTER — Encounter: Payer: Self-pay | Admitting: Internal Medicine

## 2024-07-01 VITALS — BP 124/70 | HR 84 | Ht 63.5 in | Wt 130.0 lb

## 2024-07-01 DIAGNOSIS — R194 Change in bowel habit: Secondary | ICD-10-CM | POA: Diagnosis not present

## 2024-07-01 DIAGNOSIS — R142 Eructation: Secondary | ICD-10-CM

## 2024-07-01 DIAGNOSIS — Z860101 Personal history of adenomatous and serrated colon polyps: Secondary | ICD-10-CM | POA: Diagnosis not present

## 2024-07-01 DIAGNOSIS — Z860100 Personal history of colon polyps, unspecified: Secondary | ICD-10-CM

## 2024-07-01 NOTE — Progress Notes (Signed)
 HISTORY OF PRESENT ILLNESS:  Alexandra Mcgrath is a 73 y.o. widowed female, and retired city of [employer], who is self-referred to establish GI care regarding recent issues with change in bowel habits and the future need for surveillance colonoscopy.  She is new to this practice.  *The patient underwent colonoscopy with Dr. Gladis Louder of [facility] 2015.  Found to have sessile serrated polyp. *She subsequently underwent colonoscopy with Dr. Elsie Cree of [facility] January 2024.  She was found to have 7 subcentimeter precancerous polyps and sigmoid diverticulosis.  Follow-up in 3 years recommended.  The patient tells me that she was in her usual state of health until June 2025 when after a 2-week trip to [place] she developed problems with diarrhea.  Tells me that she sought consultation with her chiropractor Dr. Larnell Chang of [facility].  She reported issues with her diarrhea and travel.  She tells me that based on muscle testing he determined that her problem was related to a parasite.  As such, he recommended a nonprescription agent called Paraset.  She tells me that thereafter she definitely felt improvement.  She did subsequently undergo formal parasitic testing of her stool with her PCP May 24, 2024.  These were negative.  Patient surmises that they were negative because she was properly treated.  Overall, her bowel habits have had improvement, though not back to her baseline.  She generally would have a daily bowel movement.  Now reports at times tendency toward constipation, though her bowels may be a bit loose at times.  She brings with her a [place] stool chart to graphically illustrate the variation in her bowel habits.  Blood work from May 24, 2024 shows unremarkable comprehensive metabolic panel.  Normal iron studies.  Normal CBC with hemoglobin 13.1.  Normal TSH.  Hemoglobin A1c 5.7  Her only other GI complaint is belching.  This has increased years.  She has  questions regarding this symptom.  REVIEW OF SYSTEMS:  All non-GI ROS negative unless otherwise stated in the HPI except for sinus and allergy trouble, anxiety, fatigue, hearing problems, sleeping problems, lower extremity edema, urinary frequency, urinary leakage  Past Medical History:  Diagnosis Date   Encounter for medical examination to establish care 11/05/2022   HLD (hyperlipidemia)     Past Surgical History:  Procedure Laterality Date   ABDOMINAL HYSTERECTOMY     partial   CHOLECYSTECTOMY     COLONOSCOPY WITH PROPOFOL  N/A 11/29/2014   Procedure: COLONOSCOPY WITH PROPOFOL ;  Surgeon: Gladis MARLA Louder, MD;  Location: [facility];  Service: Endoscopy;  Laterality: N/A;    Social History Alexandra Mcgrath  reports that she has never smoked. She has never used smokeless tobacco. She reports that she does not currently use alcohol. She reports that she does not use drugs.  family history includes Breast cancer (age of onset: 57) in her mother; Diabetes in her father and mother; Heart disease in her father and mother; Heart failure in her mother.  Allergies  Allergen Reactions   Milk Protein Nausea And Vomiting   Sulfa Antibiotics Itching   Penicillins Other (See Comments)    Passed out   Tomato Rash    headache       PHYSICAL EXAMINATION: Vital signs: BP 124/70   Pulse 84   Ht 5' 3.5 (1.613 m)   Wt 130 lb (59 kg)   BMI 22.67 kg/m   Constitutional: generally well-appearing, no acute distress Psychiatric: alert and oriented x3, cooperative Eyes:  extraocular movements intact, anicteric, conjunctiva pink Mouth: oral pharynx moist, no lesions Neck: supple no lymphadenopathy Cardiovascular: heart regular rate and rhythm, no murmur Lungs: clear to auscultation bilaterally Abdomen: soft, nontender, nondistended, no obvious ascites, no peritoneal signs, normal bowel sounds, no organomegaly Rectal: Omitted Extremities: no clubbing or cyanosis.  1+ lower extremity edema  bilaterally Skin: no lesions on visible extremities Neuro: No focal deficits.  Cranial nerves intact  ASSESSMENT:  1.  Recent problems with change in bowel habits after traveling.  Most consistent with postinfectious state.  Not clear that this was a parasitic event.  Regardless, for the most part resolved.  Currently with some bowel irregularity. 2.  History of multiple adenomatous sessile serrated polyps.  Last colonoscopy January 2024 elsewhere.  Follow-up in 3 years recommended.  I agree. 3.  Belching.  Benign.  No alarm features.  PLAN:  1.  Recommend Citrucel 2 tablespoons daily for bowel irregularities 2.  Surveillance colonoscopy January 2027.  Recall entered 3.  Reassurance regarding belching A total time of 60 minutes was spent preparing to see the patient, obtaining comprehensive history, performing medically appropriate physical examination, counseling and educating the patient regarding above listed issues, recommending symptomatic therapies, arranging follow-up surveillance colonoscopy recall, and documenting clinical information in the health record

## 2024-07-01 NOTE — Patient Instructions (Signed)
 Take 2 tablespoons of Citrucel in water or juice daily.  _______________________________________________________  If your blood pressure at your visit was 140/90 or greater, please contact your primary care physician to follow up on this.  _______________________________________________________  If you are age 73 or older, your body mass index should be between 23-30. Your Body mass index is 22.67 kg/m. If this is out of the aforementioned range listed, please consider follow up with your Primary Care Provider.  If you are age 56 or younger, your body mass index should be between 19-25. Your Body mass index is 22.67 kg/m. If this is out of the aformentioned range listed, please consider follow up with your Primary Care Provider.   ________________________________________________________  The [facility] providers would like to encourage you to use MYCHART to communicate with providers for non-urgent requests or questions.  Due to long hold times on the telephone, sending your provider a message by [facility] may be a faster and more efficient way to get a response.  Please allow 48 business hours for a response.  Please remember that this is for non-urgent requests.  _______________________________________________________  [facility] is using a team-based approach to care.  Your team is made up of your doctor and two to three APPS. Our APPS (Nurse Practitioners and Physician Assistants) work with your physician to ensure care continuity for you. They are fully qualified to address your health concerns and develop a treatment plan. They communicate directly with your gastroenterologist to care for you. Seeing the Advanced Practice Practitioners on your physician's team can help you by facilitating care more promptly, often allowing for earlier appointments, access to diagnostic testing, procedures, and other specialty referrals.

## 2024-07-22 DIAGNOSIS — L570 Actinic keratosis: Secondary | ICD-10-CM | POA: Diagnosis not present

## 2024-07-22 DIAGNOSIS — D2372 Other benign neoplasm of skin of left lower limb, including hip: Secondary | ICD-10-CM | POA: Diagnosis not present

## 2024-07-22 DIAGNOSIS — L814 Other melanin hyperpigmentation: Secondary | ICD-10-CM | POA: Diagnosis not present

## 2024-07-22 DIAGNOSIS — D2371 Other benign neoplasm of skin of right lower limb, including hip: Secondary | ICD-10-CM | POA: Diagnosis not present

## 2024-07-22 DIAGNOSIS — D2262 Melanocytic nevi of left upper limb, including shoulder: Secondary | ICD-10-CM | POA: Diagnosis not present

## 2024-07-22 DIAGNOSIS — L578 Other skin changes due to chronic exposure to nonionizing radiation: Secondary | ICD-10-CM | POA: Diagnosis not present

## 2024-07-22 DIAGNOSIS — L821 Other seborrheic keratosis: Secondary | ICD-10-CM | POA: Diagnosis not present

## 2024-07-22 DIAGNOSIS — D225 Melanocytic nevi of trunk: Secondary | ICD-10-CM | POA: Diagnosis not present

## 2024-09-07 NOTE — Progress Notes (Unsigned)
 No chief complaint on file.      PMH, PSH, SH reviewed  Hashimotos, HLD, pre-diabetes OAB ? ***     ROS:    PHYSICAL EXAM:  There were no vitals taken for this visit.  Wt Readings from Last 3 Encounters:  07/01/24 130 lb (59 kg)  05/21/24 127 lb 9.6 oz (57.9 kg)  12/09/23 135 lb 6.4 oz (61.4 kg)        ASSESSMENT/PLAN:   No immunizations on file. Can you check [facility] to see if any tetanus on file?

## 2024-09-08 ENCOUNTER — Ambulatory Visit: Admitting: Family Medicine

## 2024-09-08 ENCOUNTER — Encounter: Payer: Self-pay | Admitting: Family Medicine

## 2024-09-08 VITALS — BP 124/72 | HR 80 | Temp 99.5°F | Ht 63.5 in | Wt 130.4 lb

## 2024-09-08 DIAGNOSIS — Z23 Encounter for immunization: Secondary | ICD-10-CM | POA: Diagnosis not present

## 2024-09-08 DIAGNOSIS — S91101A Unspecified open wound of right great toe without damage to nail, initial encounter: Secondary | ICD-10-CM | POA: Diagnosis not present

## 2024-09-08 DIAGNOSIS — L03031 Cellulitis of right toe: Secondary | ICD-10-CM

## 2024-09-08 MED ORDER — DOXYCYCLINE HYCLATE 100 MG PO TABS: 100.0000 mg | ORAL_TABLET | Freq: Two times a day (BID) | ORAL | 0 refills | Status: AC

## 2024-09-08 NOTE — Patient Instructions (Signed)
 We gave you a tetanus booster today (since your last one was likely >5 years ago).  Since you had improvement with cleaning and regular use of antibacterial ointment, we can watch and wait for another day or so before starting any oral antibiotic. If you notice increased pain, swelling, redness, or any persistent fever, then please start the doxycycline  and complete the course.

## 2024-09-09 ENCOUNTER — Ambulatory Visit: Admitting: Family Medicine

## 2024-09-09 ENCOUNTER — Encounter: Payer: Self-pay | Admitting: Family Medicine

## 2024-09-09 VITALS — BP 128/72 | HR 84 | Temp 98.7°F | Ht 63.0 in | Wt 130.0 lb

## 2024-09-09 DIAGNOSIS — M67911 Unspecified disorder of synovium and tendon, right shoulder: Secondary | ICD-10-CM

## 2024-09-09 DIAGNOSIS — M25511 Pain in right shoulder: Secondary | ICD-10-CM | POA: Diagnosis not present

## 2024-09-09 DIAGNOSIS — G8929 Other chronic pain: Secondary | ICD-10-CM

## 2024-09-09 MED ORDER — MELOXICAM 15 MG PO TABS: 15.0000 mg | ORAL_TABLET | Freq: Every day | ORAL | 0 refills | Status: AC

## 2024-09-09 NOTE — Progress Notes (Signed)
 Name: Alexandra Mcgrath   Date of Visit: 09/09/24   Date of last visit with me: Visit date not found   CHIEF COMPLAINT:  Chief Complaint  Patient presents with   Shoulder Pain    Clemens in middle of June and has been having right shoulder pain since. Saw in [place] in August, still bothering her.        HPI:  Discussed the use of AI scribe software for clinical note transcription with the patient, who gave verbal consent to proceed.  History of Present Illness   Alexandra Mcgrath is a 73 year old female who presents with persistent right shoulder pain following a fall.  She has persistent pain in her right shoulder and upper arm following a fall in June while hiking on rough terrain. The pain affects the entire arm area, from top to bottom, rather than a specific point. Initially, the shoulder was sore, and although it has somewhat recovered, the pain persists, especially during certain activities. She has been performing some exercises recommended by a previous provider, but not consistently. The pain is most noticeable during yoga, which she attends one to three times a week. She experiences a lack of flexibility and describes the pain as less than five on a scale of one to ten, with movement increasing the pain to a two or three, which limits her range of motion. She was unable to lie on her right side until October, but now can do so without pain. However, she feels that her recovery has plateaued.  In addition to the shoulder pain, she has scoliosis and experiences tightness in her glutes, which she attributes to her spine being out of place. She uses an electric massage device and visits a chiropractor regularly for relief. She has considered dry needling for her back pain, which has provided relief in the past.  She is currently not taking any specific medication for the shoulder pain but has a history of using Advil for headaches. No current pain while sitting, but pain occurs with  movement of the right shoulder.         OBJECTIVE:       02/17/2024    3:46 PM  Depression screen PHQ 2/9  Decreased Interest 0  Down, Depressed, Hopeless 0  PHQ - 2 Score 0  Altered sleeping 2  Tired, decreased energy 0  Change in appetite 0  Feeling bad or failure about yourself  0  Trouble concentrating 0  Moving slowly or fidgety/restless 0  Suicidal thoughts 0  PHQ-9 Score 2   Difficult doing work/chores Not difficult at all     Data saved with a previous flowsheet row definition     BP Readings from Last 3 Encounters:  09/09/24 128/72  09/08/24 124/72  07/01/24 124/70    BP 128/72   Pulse 84   Temp 98.7 F (37.1 C) (Tympanic)   Ht 5' 3 (1.6 m)   Wt 130 lb (59 kg)   BMI 23.03 kg/m    Physical Exam   MUSCULOSKELETAL: Right shoulder pain on resisted abduction, internal rotation, elevation, and downward pressure.      Physical Exam Constitutional:      Appearance: Normal appearance.  Neurological:     General: No focal deficit present.     Mental Status: She is alert and oriented to person, place, and time. Mental status is at baseline.     ASSESSMENT/PLAN:   Assessment & Plan Chronic right shoulder pain  Disorder  of right rotator cuff    Assessment and Plan    Right rotator cuff injury Chronic injury exacerbated by yoga. Pain increases with movement. Conservative management preferred. - Ordered right shoulder x-rays at [facility]. - Prescribed meloxicam once daily with food for two weeks. - Provided rotator cuff exercises and resistance band for daily use. - Scheduled follow-up in six weeks to assess progress and consider steroid injection if improvement is less than 60-70%.  High hamstring tendinopathy, right leg Chronic tendinopathy with tight glutes and deep muscle pain. Symptoms may relate to scoliosis. Conservative management preferred. - Continue meloxicam as prescribed for anti-inflammatory effect. - Advised against dry  needling until further evaluation. - Encouraged continuation of yoga and daily activities, avoiding exercises that cause pain greater than 4/10.         Alexandra Mcgrath A. Vita MD [facility]

## 2024-09-09 NOTE — Patient Instructions (Signed)
 Please go get your xrays done at [facility]. You do not need to make an appointment. You can just show up.   Address: [address] [place], [place], [place] [address]

## 2024-09-15 DIAGNOSIS — N3946 Mixed incontinence: Secondary | ICD-10-CM | POA: Diagnosis not present

## 2024-09-15 DIAGNOSIS — L439 Lichen planus, unspecified: Secondary | ICD-10-CM | POA: Diagnosis not present

## 2024-09-15 DIAGNOSIS — R3989 Other symptoms and signs involving the genitourinary system: Secondary | ICD-10-CM | POA: Diagnosis not present

## 2024-11-18 ENCOUNTER — Ambulatory Visit: Payer: Self-pay | Admitting: Nurse Practitioner

## 2025-02-22 ENCOUNTER — Ambulatory Visit: Payer: Self-pay
